# Patient Record
Sex: Female | Born: 1985
Health system: Southern US, Community
[De-identification: ages and names within clinical notes are randomized; demographics above are authoritative.]

## PROBLEM LIST (undated history)

## (undated) DIAGNOSIS — Z720 Tobacco use: Secondary | ICD-10-CM

## (undated) HISTORY — PX: WISDOM TOOTH EXTRACTION: SHX21

## (undated) HISTORY — DX: Tobacco use: Z72.0

---

## 2007-11-19 ENCOUNTER — Ambulatory Visit: Payer: Self-pay | Admitting: Internal Medicine

## 2007-11-20 ENCOUNTER — Ambulatory Visit: Payer: Self-pay | Admitting: Internal Medicine

## 2015-03-17 ENCOUNTER — Encounter: Payer: Self-pay | Admitting: Family Medicine

## 2015-03-17 ENCOUNTER — Ambulatory Visit (INDEPENDENT_AMBULATORY_CARE_PROVIDER_SITE_OTHER): Payer: BLUE CROSS/BLUE SHIELD | Admitting: Family Medicine

## 2015-03-17 VITALS — BP 120/82 | HR 83 | Temp 98.7°F | Ht 64.4 in | Wt 162.0 lb

## 2015-03-17 DIAGNOSIS — Z0000 Encounter for general adult medical examination without abnormal findings: Secondary | ICD-10-CM | POA: Diagnosis not present

## 2015-03-17 DIAGNOSIS — Z720 Tobacco use: Secondary | ICD-10-CM | POA: Diagnosis not present

## 2015-03-17 LAB — LIPID PANEL PICCOLO, WAIVED
CHOL/HDL RATIO PICCOLO,WAIVE: 2.7 mg/dL
Cholesterol Piccolo, Waived: 144 mg/dL (ref <200)
HDL Chol Piccolo, Waived: 54 mg/dL — ABNORMAL LOW (ref >59)
LDL Chol Calc Piccolo Waived: 73 mg/dL (ref <100)
Triglycerides Piccolo,Waived: 83 mg/dL (ref <150)
VLDL Chol Calc Piccolo,Waive: 17 mg/dL (ref <30)

## 2015-03-17 LAB — GLUCOSE PICCOLO, WAIVED: GLUCOSE PICCOLO, WAIVED: 93 mg/dL (ref 73–118)

## 2015-03-17 NOTE — Assessment & Plan Note (Signed)
Not interested in quitting right now. Will let us know when she is. 

## 2015-03-17 NOTE — Progress Notes (Signed)
BP 120/82 mmHg  Pulse 83  Temp(Src) 98.7 F (37.1 C)  Ht 5' 4.4" (1.636 m)  Wt 162 lb (73.483 kg)  BMI 27.45 kg/m2  SpO2 98%   Subjective:    Patient ID: Danielle Pearson, female    DOB: 28-Mar-1986, 29 y.o.   MRN: [id number]  HPI: Danielle Pearson is a 29 y.o. female presenting on 03/17/2015 for comprehensive medical examination. Current medical complaints include: None, needs a form filled out for work  Menopausal Symptoms: no  Depression Screen done today and results listed below:  Depression screen [facility] 2/9 03/17/2015  Decreased Interest 0  Down, Depressed, Hopeless 0  PHQ - 2 Score 0     Past Medical History:  Past Medical History  Diagnosis Date  . Tobacco abuse     Surgical History:  Past Surgical History  Procedure Laterality Date  . Wisdom tooth extraction      Medications:  No current outpatient prescriptions on file prior to visit.   No current facility-administered medications on file prior to visit.    Allergies:  No Known Allergies  Social History:  Social History   Social History  . Marital Status: Married    Spouse Name: N/A  . Number of Children: N/A  . Years of Education: N/A   Occupational History  . Not on file.   Social History Main Topics  . Smoking status: Current Every Day Smoker -- 0.50 packs/day    Types: Cigarettes  . Smokeless tobacco: Never Used  . Alcohol Use: 0.0 oz/week    0 Standard drinks or equivalent per week     Comment: Socially  . Drug Use: No  . Sexual Activity: Yes    Birth Control/ Protection: IUD   Other Topics Concern  . Not on file   Social History Narrative   History  Smoking status  . Current Every Day Smoker -- 0.50 packs/day  . Types: Cigarettes  Smokeless tobacco  . Never Used   History  Alcohol Use  . 0.0 oz/week  . 0 Standard drinks or equivalent per week    Comment: Socially    Family History:  History reviewed. No pertinent family history.  Past medical history, surgical history,  medications, allergies, family history and social history reviewed with patient today and changes made to appropriate areas of the chart.   Review of Systems  Constitutional: Negative.   HENT: Negative.   Eyes: Negative.   Respiratory: Negative.   Cardiovascular: Negative.   Gastrointestinal: Negative.   Genitourinary: Negative.   Musculoskeletal: Negative.   Skin: Negative.   Neurological: Negative.   Endo/Heme/Allergies: Negative.   Psychiatric/Behavioral: Negative.     All other ROS negative except what is listed above and in the HPI.      Objective:    BP 120/82 mmHg  Pulse 83  Temp(Src) 98.7 F (37.1 C)  Ht 5' 4.4" (1.636 m)  Wt 162 lb (73.483 kg)  BMI 27.45 kg/m2  SpO2 98%  Wt Readings from Last 3 Encounters:  03/17/15 162 lb (73.483 kg)    Physical Exam  Constitutional: She is oriented to person, place, and time. She appears well-developed and well-nourished. No distress.  HENT:  Head: Normocephalic and atraumatic.  Right Ear: Hearing and external ear normal.  Left Ear: Hearing and external ear normal.  Nose: Nose normal.  Mouth/Throat: Oropharynx is clear and moist. No oropharyngeal exudate.  Eyes: Conjunctivae, EOM and lids are normal. Pupils are equal, round, and reactive to light. Right eye exhibits  no discharge. Left eye exhibits no discharge. No scleral icterus.  Neck: Normal range of motion. Neck supple. No JVD present. No tracheal deviation present. No thyromegaly present.  Cardiovascular: Normal rate, regular rhythm, normal heart sounds and intact distal pulses.  Exam reveals no gallop and no friction rub.   No murmur heard. Pulmonary/Chest: Effort normal and breath sounds normal. No stridor. No respiratory distress. She has no wheezes. She has no rales. She exhibits no tenderness.  Abdominal: Soft. Bowel sounds are normal. She exhibits no distension and no mass. There is no tenderness. There is no rebound and no guarding.  Genitourinary:  Breast and  GYN exam deferred, done at GYN  Musculoskeletal: Normal range of motion. She exhibits no edema or tenderness.  Lymphadenopathy:    She has no cervical adenopathy.  Neurological: She is alert and oriented to person, place, and time. She has normal reflexes. She displays normal reflexes. No cranial nerve deficit. She exhibits normal muscle tone. Coordination normal.  Skin: Skin is warm, dry and intact. No rash noted. She is not diaphoretic. No erythema. No pallor.  Psychiatric: She has a normal mood and affect. Her speech is normal and behavior is normal. Judgment and thought content normal. Cognition and memory are normal.  Nursing note and vitals reviewed.   No results found for this or any previous visit.    Assessment & Plan:   Problem List Items Addressed This Visit      Other   Tobacco abuse    Not interested in quitting right now. Will let us know when she is.        Other Visit Diagnoses    Routine general medical examination at a health care facility    -  Primary    Up to date on Tdap, due for Pap, but sees GYN. Screening labs done today. Work on diet and exercise. Form for work filled out and scanned.     Relevant Orders    CBC with Differential/Platelet    Comprehensive metabolic panel    Glucose Hemocue Waived    Lipid Panel Piccolo, Waived    TSH        Follow up plan: Return in about 1 year (around 03/16/2016) for Physical.   LABORATORY TESTING:  - Pap smear: done elsewhere  IMMUNIZATIONS:   - Tdap: Tetanus vaccination status reviewed: last tetanus booster within 10 years. - Influenza: Refused - Pneumovax: Refused  PATIENT COUNSELING:   Advised to take 1 mg of folate supplement per day if capable of pregnancy.   Sexuality: Discussed sexually transmitted diseases, partner selection, use of condoms, avoidance of unintended pregnancy  and contraceptive alternatives.   Advised to avoid cigarette smoking.  I discussed with the patient that most people  either abstain from alcohol or drink within safe limits (<=14/week and <=4 drinks/occasion for males, <=7/weeks and <= 3 drinks/occasion for females) and that the risk for alcohol disorders and other health effects rises proportionally with the number of drinks per week and how often a drinker exceeds daily limits.  Discussed cessation/primary prevention of drug use and availability of treatment for abuse.   Diet: Encouraged to adjust caloric intake to maintain  or achieve ideal body weight, to reduce intake of dietary saturated fat and total fat, to limit sodium intake by avoiding high sodium foods and not adding table salt, and to maintain adequate dietary potassium and calcium preferably from fresh fruits, vegetables, and low-fat dairy products.    stressed the importance of regular  exercise  Injury prevention: Discussed safety belts, safety helmets, smoke detector, smoking near bedding or upholstery.   Dental health: Discussed importance of regular tooth brushing, flossing, and dental visits.    NEXT PREVENTATIVE PHYSICAL DUE IN 1 YEAR. Return in about 1 year (around 03/16/2016) for Physical.

## 2015-03-17 NOTE — Patient Instructions (Signed)
Preventive Care for Adults A healthy lifestyle and preventive care can promote health and wellness. Preventive health guidelines for women include the following key practices.  A routine yearly physical is a good way to check with your health care provider about your health and preventive screening. It is a chance to share any concerns and updates on your health and to receive a thorough exam.  Visit your dentist for a routine exam and preventive care every 6 months. Brush your teeth twice a day and floss once a day. Good oral hygiene prevents tooth decay and gum disease.  The frequency of eye exams is based on your age, health, family medical history, use of contact lenses, and other factors. Follow your health care provider's recommendations for frequency of eye exams.  Eat a healthy diet. Foods like vegetables, fruits, whole grains, low-fat dairy products, and lean protein foods contain the nutrients you need without too many calories. Decrease your intake of foods high in solid fats, added sugars, and salt. Eat the right amount of calories for you.Get information about a proper diet from your health care provider, if necessary.  Regular physical exercise is one of the most important things you can do for your health. Most adults should get at least 150 minutes of moderate-intensity exercise (any activity that increases your heart rate and causes you to sweat) each week. In addition, most adults need muscle-strengthening exercises on 2 or more days a week.  Maintain a healthy weight. The body mass index (BMI) is a screening tool to identify possible weight problems. It provides an estimate of body fat based on height and weight. Your health care provider can find your BMI and can help you achieve or maintain a healthy weight.For adults 20 years and older:  A BMI below 18.5 is considered underweight.  A BMI of 18.5 to 24.9 is normal.  A BMI of 25 to 29.9 is considered overweight.  A BMI of  30 and above is considered obese.  Maintain normal blood lipids and cholesterol levels by exercising and minimizing your intake of saturated fat. Eat a balanced diet with plenty of fruit and vegetables. Blood tests for lipids and cholesterol should begin at age 76 and be repeated every 5 years. If your lipid or cholesterol levels are high, you are over 50, or you are at high risk for heart disease, you may need your cholesterol levels checked more frequently.Ongoing high lipid and cholesterol levels should be treated with medicines if diet and exercise are not working.  If you smoke, find out from your health care provider how to quit. If you do not use tobacco, do not start.  Lung cancer screening is recommended for adults aged 22-80 years who are at high risk for developing lung cancer because of a history of smoking. A yearly low-dose CT scan of the lungs is recommended for people who have at least a 30-pack-year history of smoking and are a current smoker or have quit within the past 15 years. A pack year of smoking is smoking an average of 1 pack of cigarettes a day for 1 year (for example: 1 pack a day for 30 years or 2 packs a day for 15 years). Yearly screening should continue until the smoker has stopped smoking for at least 15 years. Yearly screening should be stopped for people who develop a health problem that would prevent them from having lung cancer treatment.  If you are pregnant, do not drink alcohol. If you are breastfeeding,  be very cautious about drinking alcohol. If you are not pregnant and choose to drink alcohol, do not have more than 1 drink per day. One drink is considered to be 12 ounces (355 mL) of beer, 5 ounces (148 mL) of wine, or 1.5 ounces (44 mL) of liquor.  Avoid use of street drugs. Do not share needles with anyone. Ask for help if you need support or instructions about stopping the use of drugs.  High blood pressure causes heart disease and increases the risk of  stroke. Your blood pressure should be checked at least every 1 to 2 years. Ongoing high blood pressure should be treated with medicines if weight loss and exercise do not work.  If you are 3-86 years old, ask your health care provider if you should take aspirin to prevent strokes.  Diabetes screening involves taking a blood sample to check your fasting blood sugar level. This should be done once every 3 years, after age 67, if you are within normal weight and without risk factors for diabetes. Testing should be considered at a younger age or be carried out more frequently if you are overweight and have at least 1 risk factor for diabetes.  Breast cancer screening is essential preventive care for women. You should practice "breast self-awareness." This means understanding the normal appearance and feel of your breasts and may include breast self-examination. Any changes detected, no matter how small, should be reported to a health care provider. Women in their 8s and 30s should have a clinical breast exam (CBE) by a health care provider as part of a regular health exam every 1 to 3 years. After age 70, women should have a CBE every year. Starting at age 25, women should consider having a mammogram (breast X-ray test) every year. Women who have a family history of breast cancer should talk to their health care provider about genetic screening. Women at a high risk of breast cancer should talk to their health care providers about having an MRI and a mammogram every year.  Breast cancer gene (BRCA)-related cancer risk assessment is recommended for women who have family members with BRCA-related cancers. BRCA-related cancers include breast, ovarian, tubal, and peritoneal cancers. Having family members with these cancers may be associated with an increased risk for harmful changes (mutations) in the breast cancer genes BRCA1 and BRCA2. Results of the assessment will determine the need for genetic counseling and  BRCA1 and BRCA2 testing.  Routine pelvic exams to screen for cancer are no longer recommended for nonpregnant women who are considered low risk for cancer of the pelvic organs (ovaries, uterus, and vagina) and who do not have symptoms. Ask your health care provider if a screening pelvic exam is right for you.  If you have had past treatment for cervical cancer or a condition that could lead to cancer, you need Pap tests and screening for cancer for at least 20 years after your treatment. If Pap tests have been discontinued, your risk factors (such as having a new sexual partner) need to be reassessed to determine if screening should be resumed. Some women have medical problems that increase the chance of getting cervical cancer. In these cases, your health care provider may recommend more frequent screening and Pap tests.  The HPV test is an additional test that may be used for cervical cancer screening. The HPV test looks for the virus that can cause the cell changes on the cervix. The cells collected during the Pap test can be  tested for HPV. The HPV test could be used to screen women aged 30 years and older, and should be used in women of any age who have unclear Pap test results. After the age of 30, women should have HPV testing at the same frequency as a Pap test.  Colorectal cancer can be detected and often prevented. Most routine colorectal cancer screening begins at the age of 50 years and continues through age 75 years. However, your health care provider may recommend screening at an earlier age if you have risk factors for colon cancer. On a yearly basis, your health care provider may provide home test kits to check for hidden blood in the stool. Use of a small camera at the end of a tube, to directly examine the colon (sigmoidoscopy or colonoscopy), can detect the earliest forms of colorectal cancer. Talk to your health care provider about this at age 50, when routine screening begins. Direct  exam of the colon should be repeated every 5-10 years through age 75 years, unless early forms of pre-cancerous polyps or small growths are found.  People who are at an increased risk for hepatitis B should be screened for this virus. You are considered at high risk for hepatitis B if:  You were born in a country where hepatitis B occurs often. Talk with your health care provider about which countries are considered high risk.  Your parents were born in a high-risk country and you have not received a shot to protect against hepatitis B (hepatitis B vaccine).  You have HIV or AIDS.  You use needles to inject street drugs.  You live with, or have sex with, someone who has hepatitis B.  You get hemodialysis treatment.  You take certain medicines for conditions like cancer, organ transplantation, and autoimmune conditions.  Hepatitis C blood testing is recommended for all people born from 1945 through 1965 and any individual with known risks for hepatitis C.  Practice safe sex. Use condoms and avoid high-risk sexual practices to reduce the spread of sexually transmitted infections (STIs). STIs include gonorrhea, chlamydia, syphilis, trichomonas, herpes, HPV, and human immunodeficiency virus (HIV). Herpes, HIV, and HPV are viral illnesses that have no cure. They can result in disability, cancer, and death.  You should be screened for sexually transmitted illnesses (STIs) including gonorrhea and chlamydia if:  You are sexually active and are younger than 24 years.  You are older than 24 years and your health care provider tells you that you are at risk for this type of infection.  Your sexual activity has changed since you were last screened and you are at an increased risk for chlamydia or gonorrhea. Ask your health care provider if you are at risk.  If you are at risk of being infected with HIV, it is recommended that you take a prescription medicine daily to prevent HIV infection. This is  called preexposure prophylaxis (PrEP). You are considered at risk if:  You are a heterosexual woman, are sexually active, and are at increased risk for HIV infection.  You take drugs by injection.  You are sexually active with a partner who has HIV.  Talk with your health care provider about whether you are at high risk of being infected with HIV. If you choose to begin PrEP, you should first be tested for HIV. You should then be tested every 3 months for as long as you are taking PrEP.  Osteoporosis is a disease in which the bones lose minerals and strength   with aging. This can result in serious bone fractures or breaks. The risk of osteoporosis can be identified using a bone density scan. Women ages 65 years and over and women at risk for fractures or osteoporosis should discuss screening with their health care providers. Ask your health care provider whether you should take a calcium supplement or vitamin D to reduce the rate of osteoporosis.  Menopause can be associated with physical symptoms and risks. Hormone replacement therapy is available to decrease symptoms and risks. You should talk to your health care provider about whether hormone replacement therapy is right for you.  Use sunscreen. Apply sunscreen liberally and repeatedly throughout the day. You should seek shade when your shadow is shorter than you. Protect yourself by wearing long sleeves, pants, a wide-brimmed hat, and sunglasses year round, whenever you are outdoors.  Once a month, do a whole body skin exam, using a mirror to look at the skin on your back. Tell your health care provider of new moles, moles that have irregular borders, moles that are larger than a pencil eraser, or moles that have changed in shape or color.  Stay current with required vaccines (immunizations).  Influenza vaccine. All adults should be immunized every year.  Tetanus, diphtheria, and acellular pertussis (Td, Tdap) vaccine. Pregnant women should  receive 1 dose of Tdap vaccine during each pregnancy. The dose should be obtained regardless of the length of time since the last dose. Immunization is preferred during the 27th-36th week of gestation. An adult who has not previously received Tdap or who does not know her vaccine status should receive 1 dose of Tdap. This initial dose should be followed by tetanus and diphtheria toxoids (Td) booster doses every 10 years. Adults with an unknown or incomplete history of completing a 3-dose immunization series with Td-containing vaccines should begin or complete a primary immunization series including a Tdap dose. Adults should receive a Td booster every 10 years.  Varicella vaccine. An adult without evidence of immunity to varicella should receive 2 doses or a second dose if she has previously received 1 dose. Pregnant females who do not have evidence of immunity should receive the first dose after pregnancy. This first dose should be obtained before leaving the health care facility. The second dose should be obtained 4-8 weeks after the first dose.  Human papillomavirus (HPV) vaccine. Females aged 13-26 years who have not received the vaccine previously should obtain the 3-dose series. The vaccine is not recommended for use in pregnant females. However, pregnancy testing is not needed before receiving a dose. If a female is found to be pregnant after receiving a dose, no treatment is needed. In that case, the remaining doses should be delayed until after the pregnancy. Immunization is recommended for any person with an immunocompromised condition through the age of 26 years if she did not get any or all doses earlier. During the 3-dose series, the second dose should be obtained 4-8 weeks after the first dose. The third dose should be obtained 24 weeks after the first dose and 16 weeks after the second dose.  Zoster vaccine. One dose is recommended for adults aged 60 years or older unless certain conditions are  present.  Measles, mumps, and rubella (MMR) vaccine. Adults born before 1957 generally are considered immune to measles and mumps. Adults born in 1957 or later should have 1 or more doses of MMR vaccine unless there is a contraindication to the vaccine or there is laboratory evidence of immunity to   each of the three diseases. A routine second dose of MMR vaccine should be obtained at least 28 days after the first dose for students attending postsecondary schools, health care workers, or international travelers. People who received inactivated measles vaccine or an unknown type of measles vaccine during 1963-1967 should receive 2 doses of MMR vaccine. People who received inactivated mumps vaccine or an unknown type of mumps vaccine before 1979 and are at high risk for mumps infection should consider immunization with 2 doses of MMR vaccine. For females of childbearing age, rubella immunity should be determined. If there is no evidence of immunity, females who are not pregnant should be vaccinated. If there is no evidence of immunity, females who are pregnant should delay immunization until after pregnancy. Unvaccinated health care workers born before 1957 who lack laboratory evidence of measles, mumps, or rubella immunity or laboratory confirmation of disease should consider measles and mumps immunization with 2 doses of MMR vaccine or rubella immunization with 1 dose of MMR vaccine.  Pneumococcal 13-valent conjugate (PCV13) vaccine. When indicated, a person who is uncertain of her immunization history and has no record of immunization should receive the PCV13 vaccine. An adult aged 19 years or older who has certain medical conditions and has not been previously immunized should receive 1 dose of PCV13 vaccine. This PCV13 should be followed with a dose of pneumococcal polysaccharide (PPSV23) vaccine. The PPSV23 vaccine dose should be obtained at least 8 weeks after the dose of PCV13 vaccine. An adult aged 19  years or older who has certain medical conditions and previously received 1 or more doses of PPSV23 vaccine should receive 1 dose of PCV13. The PCV13 vaccine dose should be obtained 1 or more years after the last PPSV23 vaccine dose.  Pneumococcal polysaccharide (PPSV23) vaccine. When PCV13 is also indicated, PCV13 should be obtained first. All adults aged 65 years and older should be immunized. An adult younger than age 65 years who has certain medical conditions should be immunized. Any person who resides in a nursing home or long-term care facility should be immunized. An adult smoker should be immunized. People with an immunocompromised condition and certain other conditions should receive both PCV13 and PPSV23 vaccines. People with human immunodeficiency virus (HIV) infection should be immunized as soon as possible after diagnosis. Immunization during chemotherapy or radiation therapy should be avoided. Routine use of PPSV23 vaccine is not recommended for American Indians, [place] Natives, or people younger than 65 years unless there are medical conditions that require PPSV23 vaccine. When indicated, people who have unknown immunization and have no record of immunization should receive PPSV23 vaccine. One-time revaccination 5 years after the first dose of PPSV23 is recommended for people aged 19-64 years who have chronic kidney failure, nephrotic syndrome, asplenia, or immunocompromised conditions. People who received 1-2 doses of PPSV23 before age 65 years should receive another dose of PPSV23 vaccine at age 65 years or later if at least 5 years have passed since the previous dose. Doses of PPSV23 are not needed for people immunized with PPSV23 at or after age 65 years.  Meningococcal vaccine. Adults with asplenia or persistent complement component deficiencies should receive 2 doses of quadrivalent meningococcal conjugate (MenACWY-D) vaccine. The doses should be obtained at least 2 months apart.  Microbiologists working with certain meningococcal bacteria, military recruits, people at risk during an outbreak, and people who travel to or live in countries with a high rate of meningitis should be immunized. A first-year college student up through age   21 years who is living in a residence hall should receive a dose if she did not receive a dose on or after her 16th birthday. Adults who have certain high-risk conditions should receive one or more doses of vaccine.  Hepatitis A vaccine. Adults who wish to be protected from this disease, have certain high-risk conditions, work with hepatitis A-infected animals, work in hepatitis A research labs, or travel to or work in countries with a high rate of hepatitis A should be immunized. Adults who were previously unvaccinated and who anticipate close contact with an international adoptee during the first 60 days after arrival in the [place] from a country with a high rate of hepatitis A should be immunized.  Hepatitis B vaccine. Adults who wish to be protected from this disease, have certain high-risk conditions, may be exposed to blood or other infectious body fluids, are household contacts or sex partners of hepatitis B positive people, are clients or workers in certain care facilities, or travel to or work in countries with a high rate of hepatitis B should be immunized.  Haemophilus influenzae type b (Hib) vaccine. A previously unvaccinated person with asplenia or sickle cell disease or having a scheduled splenectomy should receive 1 dose of Hib vaccine. Regardless of previous immunization, a recipient of a hematopoietic stem cell transplant should receive a 3-dose series 6-12 months after her successful transplant. Hib vaccine is not recommended for adults with HIV infection. Preventive Services / Frequency Ages 64 to 68 years  Blood pressure check.** / Every 1 to 2 years.  Lipid and cholesterol check.** / Every 5 years beginning at age  22.  Clinical breast exam.** / Every 3 years for women in their 88s and 53s.  BRCA-related cancer risk assessment.** / For women who have family members with a BRCA-related cancer (breast, ovarian, tubal, or peritoneal cancers).  Pap test.** / Every 2 years from ages 90 through 51. Every 3 years starting at age 21 through age 56 or 3 with a history of 3 consecutive normal Pap tests.  HPV screening.** / Every 3 years from ages 24 through ages 1 to 46 with a history of 3 consecutive normal Pap tests.  Hepatitis C blood test.** / For any individual with known risks for hepatitis C.  Skin self-exam. / Monthly.  Influenza vaccine. / Every year.  Tetanus, diphtheria, and acellular pertussis (Tdap, Td) vaccine.** / Consult your health care provider. Pregnant women should receive 1 dose of Tdap vaccine during each pregnancy. 1 dose of Td every 10 years.  Varicella vaccine.** / Consult your health care provider. Pregnant females who do not have evidence of immunity should receive the first dose after pregnancy.  HPV vaccine. / 3 doses over 6 months, if 72 and younger. The vaccine is not recommended for use in pregnant females. However, pregnancy testing is not needed before receiving a dose.  Measles, mumps, rubella (MMR) vaccine.** / You need at least 1 dose of MMR if you were born in 1957 or later. You may also need a 2nd dose. For females of childbearing age, rubella immunity should be determined. If there is no evidence of immunity, females who are not pregnant should be vaccinated. If there is no evidence of immunity, females who are pregnant should delay immunization until after pregnancy.  Pneumococcal 13-valent conjugate (PCV13) vaccine.** / Consult your health care provider.  Pneumococcal polysaccharide (PPSV23) vaccine.** / 1 to 2 doses if you smoke cigarettes or if you have certain conditions.  Meningococcal vaccine.** /  1 dose if you are age 19 to 21 years and a first-year college  student living in a residence hall, or have one of several medical conditions, you need to get vaccinated against meningococcal disease. You may also need additional booster doses.  Hepatitis A vaccine.** / Consult your health care provider.  Hepatitis B vaccine.** / Consult your health care provider.  Haemophilus influenzae type b (Hib) vaccine.** / Consult your health care provider. Ages 40 to 64 years  Blood pressure check.** / Every 1 to 2 years.  Lipid and cholesterol check.** / Every 5 years beginning at age 20 years.  Lung cancer screening. / Every year if you are aged 55-80 years and have a 30-pack-year history of smoking and currently smoke or have quit within the past 15 years. Yearly screening is stopped once you have quit smoking for at least 15 years or develop a health problem that would prevent you from having lung cancer treatment.  Clinical breast exam.** / Every year after age 40 years.  BRCA-related cancer risk assessment.** / For women who have family members with a BRCA-related cancer (breast, ovarian, tubal, or peritoneal cancers).  Mammogram.** / Every year beginning at age 40 years and continuing for as long as you are in good health. Consult with your health care provider.  Pap test.** / Every 3 years starting at age 30 years through age 65 or 70 years with a history of 3 consecutive normal Pap tests.  HPV screening.** / Every 3 years from ages 30 years through ages 65 to 70 years with a history of 3 consecutive normal Pap tests.  Fecal occult blood test (FOBT) of stool. / Every year beginning at age 50 years and continuing until age 75 years. You may not need to do this test if you get a colonoscopy every 10 years.  Flexible sigmoidoscopy or colonoscopy.** / Every 5 years for a flexible sigmoidoscopy or every 10 years for a colonoscopy beginning at age 50 years and continuing until age 75 years.  Hepatitis C blood test.** / For all people born from 1945 through  1965 and any individual with known risks for hepatitis C.  Skin self-exam. / Monthly.  Influenza vaccine. / Every year.  Tetanus, diphtheria, and acellular pertussis (Tdap/Td) vaccine.** / Consult your health care provider. Pregnant women should receive 1 dose of Tdap vaccine during each pregnancy. 1 dose of Td every 10 years.  Varicella vaccine.** / Consult your health care provider. Pregnant females who do not have evidence of immunity should receive the first dose after pregnancy.  Zoster vaccine.** / 1 dose for adults aged 60 years or older.  Measles, mumps, rubella (MMR) vaccine.** / You need at least 1 dose of MMR if you were born in 1957 or later. You may also need a 2nd dose. For females of childbearing age, rubella immunity should be determined. If there is no evidence of immunity, females who are not pregnant should be vaccinated. If there is no evidence of immunity, females who are pregnant should delay immunization until after pregnancy.  Pneumococcal 13-valent conjugate (PCV13) vaccine.** / Consult your health care provider.  Pneumococcal polysaccharide (PPSV23) vaccine.** / 1 to 2 doses if you smoke cigarettes or if you have certain conditions.  Meningococcal vaccine.** / Consult your health care provider.  Hepatitis A vaccine.** / Consult your health care provider.  Hepatitis B vaccine.** / Consult your health care provider.  Haemophilus influenzae type b (Hib) vaccine.** / Consult your health care provider. Ages 65   years and over  Blood pressure check.** / Every 1 to 2 years.  Lipid and cholesterol check.** / Every 5 years beginning at age 22 years.  Lung cancer screening. / Every year if you are aged 73-80 years and have a 30-pack-year history of smoking and currently smoke or have quit within the past 15 years. Yearly screening is stopped once you have quit smoking for at least 15 years or develop a health problem that would prevent you from having lung cancer  treatment.  Clinical breast exam.** / Every year after age 4 years.  BRCA-related cancer risk assessment.** / For women who have family members with a BRCA-related cancer (breast, ovarian, tubal, or peritoneal cancers).  Mammogram.** / Every year beginning at age 40 years and continuing for as long as you are in good health. Consult with your health care provider.  Pap test.** / Every 3 years starting at age 9 years through age 34 or 91 years with 3 consecutive normal Pap tests. Testing can be stopped between 65 and 70 years with 3 consecutive normal Pap tests and no abnormal Pap or HPV tests in the past 10 years.  HPV screening.** / Every 3 years from ages 57 years through ages 64 or 45 years with a history of 3 consecutive normal Pap tests. Testing can be stopped between 65 and 70 years with 3 consecutive normal Pap tests and no abnormal Pap or HPV tests in the past 10 years.  Fecal occult blood test (FOBT) of stool. / Every year beginning at age 15 years and continuing until age 17 years. You may not need to do this test if you get a colonoscopy every 10 years.  Flexible sigmoidoscopy or colonoscopy.** / Every 5 years for a flexible sigmoidoscopy or every 10 years for a colonoscopy beginning at age 86 years and continuing until age 71 years.  Hepatitis C blood test.** / For all people born from [birth year] through 1965 and any individual with known risks for hepatitis C.  Osteoporosis screening.** / A one-time screening for women ages 83 years and over and women at risk for fractures or osteoporosis.  Skin self-exam. / Monthly.  Influenza vaccine. / Every year.  Tetanus, diphtheria, and acellular pertussis (Tdap/Td) vaccine.** / 1 dose of Td every 10 years.  Varicella vaccine.** / Consult your health care provider.  Zoster vaccine.** / 1 dose for adults aged 61 years or older.  Pneumococcal 13-valent conjugate (PCV13) vaccine.** / Consult your health care provider.  Pneumococcal  polysaccharide (PPSV23) vaccine.** / 1 dose for all adults aged 28 years and older.  Meningococcal vaccine.** / Consult your health care provider.  Hepatitis A vaccine.** / Consult your health care provider.  Hepatitis B vaccine.** / Consult your health care provider.  Haemophilus influenzae type b (Hib) vaccine.** / Consult your health care provider. ** Family history and personal history of risk and conditions may change your health care provider's recommendations. Document Released: 07/30/2001 Document Revised: 10/18/2013 Document Reviewed: 10/29/2010 [facility] Patient Information 2015 [place], [place]. This information is not intended to replace advice given to you by your health care provider. Make sure you discuss any questions you have with your health care provider.

## 2015-03-18 LAB — COMPREHENSIVE METABOLIC PANEL
A/G RATIO: 2.1 (ref 1.1–2.5)
ALBUMIN: 4.7 g/dL (ref 3.5–5.5)
ALT: 18 IU/L (ref 0–32)
AST: 12 IU/L (ref 0–40)
Alkaline Phosphatase: 43 IU/L (ref 39–117)
BUN/Creatinine Ratio: 13 (ref 8–20)
BUN: 9 mg/dL (ref 6–20)
Bilirubin Total: 0.6 mg/dL (ref 0.0–1.2)
CALCIUM: 9.6 mg/dL (ref 8.7–10.2)
CO2: 20 mmol/L (ref 18–29)
CREATININE: 0.7 mg/dL (ref 0.57–1.00)
Chloride: 103 mmol/L (ref 97–108)
GFR, EST AFRICAN AMERICAN: 135 mL/min/{1.73_m2} (ref >59)
GFR, EST NON AFRICAN AMERICAN: 117 mL/min/{1.73_m2} (ref >59)
GLOBULIN, TOTAL: 2.2 g/dL (ref 1.5–4.5)
Glucose: 87 mg/dL (ref 65–99)
POTASSIUM: 4.6 mmol/L (ref 3.5–5.2)
Sodium: 137 mmol/L (ref 134–144)
TOTAL PROTEIN: 6.9 g/dL (ref 6.0–8.5)

## 2015-03-18 LAB — CBC WITH DIFFERENTIAL/PLATELET
BASOS: 1 %
Basophils Absolute: 0.1 10*3/uL (ref 0.0–0.2)
EOS (ABSOLUTE): 0.4 10*3/uL (ref 0.0–0.4)
EOS: 7 %
HEMATOCRIT: 40.1 % (ref 34.0–46.6)
HEMOGLOBIN: 13.5 g/dL (ref 11.1–15.9)
IMMATURE GRANS (ABS): 0 10*3/uL (ref 0.0–0.1)
IMMATURE GRANULOCYTES: 0 %
LYMPHS: 40 %
Lymphocytes Absolute: 2.7 10*3/uL (ref 0.7–3.1)
MCH: 32.4 pg (ref 26.6–33.0)
MCHC: 33.7 g/dL (ref 31.5–35.7)
MCV: 96 fL (ref 79–97)
MONOCYTES: 7 %
MONOS ABS: 0.5 10*3/uL (ref 0.1–0.9)
NEUTROS PCT: 45 %
Neutrophils Absolute: 3 10*3/uL (ref 1.4–7.0)
Platelets: 377 10*3/uL (ref 150–379)
RBC: 4.17 x10E6/uL (ref 3.77–5.28)
RDW: 12.5 % (ref 12.3–15.4)
WBC: 6.7 10*3/uL (ref 3.4–10.8)

## 2015-03-18 LAB — TSH: TSH: 2.57 u[IU]/mL (ref 0.450–4.500)

## 2015-03-20 ENCOUNTER — Encounter: Payer: Self-pay | Admitting: Family Medicine

## 2015-09-14 ENCOUNTER — Ambulatory Visit: Payer: BLUE CROSS/BLUE SHIELD | Admitting: Family Medicine

## 2016-01-29 ENCOUNTER — Ambulatory Visit (INDEPENDENT_AMBULATORY_CARE_PROVIDER_SITE_OTHER): Payer: BLUE CROSS/BLUE SHIELD | Admitting: Family Medicine

## 2016-01-29 ENCOUNTER — Encounter: Payer: Self-pay | Admitting: Family Medicine

## 2016-01-29 VITALS — BP 131/84 | HR 96 | Temp 98.3°F | Ht 64.7 in | Wt 164.0 lb

## 2016-01-29 DIAGNOSIS — Z021 Encounter for pre-employment examination: Secondary | ICD-10-CM

## 2016-01-29 DIAGNOSIS — M5431 Sciatica, right side: Secondary | ICD-10-CM | POA: Diagnosis not present

## 2016-01-29 LAB — LP+GLUCOSE PICCOLO, WAIVED
CHOL/HDL RATIO PICCOLO,WAIVE: 3 mg/dL
Cholesterol Piccolo, Waived: 157 mg/dL (ref <200)
GLUCOSE PICCOLO, WAIVED: 94 mg/dL (ref 73–118)
HDL Chol Piccolo, Waived: 52 mg/dL — ABNORMAL LOW (ref >59)
LDL CHOL CALC PICCOLO WAIVED: 75 mg/dL (ref <100)
Triglycerides Piccolo,Waived: 153 mg/dL — ABNORMAL HIGH (ref <150)
VLDL Chol Calc Piccolo,Waive: 31 mg/dL — ABNORMAL HIGH (ref <30)

## 2016-01-29 MED ORDER — CYCLOBENZAPRINE HCL 10 MG PO TABS: 10.0000 mg | ORAL_TABLET | Freq: Every day | ORAL | 0 refills | Status: DC

## 2016-01-29 NOTE — Progress Notes (Signed)
BP 131/84 (BP Location: Left Arm, Patient Position: Sitting, Cuff Size: Normal)   Pulse 96   Temp 98.3 F (36.8 C)   Ht 5' 4.7" (1.643 m)   Wt 164 lb (74.4 kg)   SpO2 99%   BMI 27.54 kg/m    Subjective:    Patient ID: Danielle Pearson, female    DOB: [birth year], 30 y.o.   MRN: [id number]  HPI: Danielle Pearson is a 30 y.o. female  Chief Complaint  Patient presents with  . wellness form   BACK PAIN Duration: weeks Mechanism of injury: unknown Location: R>L and low back Onset: sudden Severity: mild Quality: dull aching Frequency: intermittent about 1x a week Radiation: R leg below the knee Aggravating factors: unknown Alleviating factors: nothing Status: fluctuating Treatments attempted: ibuprofen 800mg   Relief with NSAIDs?: mild Nighttime pain:  no Paresthesias / decreased sensation:  yes Bowel / bladder incontinence:  no Fevers:  no Dysuria / urinary frequency:  no  Needs a form for work filled out and needs Lipid and glucose  Relevant past medical, surgical, family and social history reviewed and updated as indicated. Interim medical history since our last visit reviewed. Allergies and medications reviewed and updated.  Review of Systems  Constitutional: Negative.   Respiratory: Negative.   Cardiovascular: Negative.   Musculoskeletal: Positive for back pain and myalgias. Negative for arthralgias, gait problem, joint swelling, neck pain and neck stiffness.  Psychiatric/Behavioral: Negative.     Per HPI unless specifically indicated above     Objective:    BP 131/84 (BP Location: Left Arm, Patient Position: Sitting, Cuff Size: Normal)   Pulse 96   Temp 98.3 F (36.8 C)   Ht 5' 4.7" (1.643 m)   Wt 164 lb (74.4 kg)   SpO2 99%   BMI 27.54 kg/m   Wt Readings from Last 3 Encounters:  01/29/16 164 lb (74.4 kg)  03/17/15 162 lb (73.5 kg)    Physical Exam  Constitutional: She is oriented to person, place, and time. She appears well-developed and  well-nourished. No distress.  HENT:  Head: Normocephalic and atraumatic.  Right Ear: Hearing normal.  Left Ear: Hearing normal.  Nose: Nose normal.  Eyes: Conjunctivae and lids are normal. Right eye exhibits no discharge. Left eye exhibits no discharge. No scleral icterus.  Pulmonary/Chest: Effort normal. No respiratory distress.  Neurological: She is alert and oriented to person, place, and time.  Skin: Skin is warm, dry and intact. No rash noted. She is not diaphoretic. No erythema. No pallor.  Psychiatric: She has a normal mood and affect. Her speech is normal and behavior is normal. Judgment and thought content normal. Cognition and memory are normal.  Nursing note and vitals reviewed. Back Exam:    Inspection:  Normal spinal curvature.  No deformity, ecchymosis, erythema, or lesions     Palpation:     Midline spinal tenderness: no      Paralumbar tenderness: no      Parathoracic tenderness: no      Buttocks tenderness: no     Range of Motion:      Flexion: Normal     Extension:Decreased     Lateral bending:Decreased    Rotation:Normal    Neuro Exam:Lower extremity DTRs normal & symmetric.  Strength and sensation intact.    Special Tests:      Straight leg raise:negative   Results for orders placed or performed in visit on 03/17/15  CBC with Differential/Platelet  Result Value Ref Range  WBC 6.7 3.4 - 10.8 x10E3/uL   RBC 4.17 3.77 - 5.28 x10E6/uL   Hemoglobin 13.5 11.1 - 15.9 g/dL   Hematocrit 16.140.1 09.634.0 - 46.6 %   MCV 96 79 - 97 fL   MCH 32.4 26.6 - 33.0 pg   MCHC 33.7 31.5 - 35.7 g/dL   RDW 04.512.5 40.912.3 - 81.115.4 %   Platelets 377 150 - 379 x10E3/uL   Neutrophils 45 %   Lymphs 40 %   Monocytes 7 %   Eos 7 %   Basos 1 %   Neutrophils Absolute 3.0 1.4 - 7.0 x10E3/uL   Lymphocytes Absolute 2.7 0.7 - 3.1 x10E3/uL   Monocytes Absolute 0.5 0.1 - 0.9 x10E3/uL   EOS (ABSOLUTE) 0.4 0.0 - 0.4 x10E3/uL   Basophils Absolute 0.1 0.0 - 0.2 x10E3/uL   Immature Granulocytes 0 %    Immature Grans (Abs) 0.0 0.0 - 0.1 x10E3/uL  Comprehensive metabolic panel  Result Value Ref Range   Glucose 87 65 - 99 mg/dL   BUN 9 6 - 20 mg/dL   Creatinine, Ser 9.140.70 0.57 - 1.00 mg/dL   GFR calc non Af Amer 117 >59 mL/min/1.73   GFR calc Af Amer 135 >59 mL/min/1.73   BUN/Creatinine Ratio 13 8 - 20   Sodium 137 134 - 144 mmol/L   Potassium 4.6 3.5 - 5.2 mmol/L   Chloride 103 97 - 108 mmol/L   CO2 20 18 - 29 mmol/L   Calcium 9.6 8.7 - 10.2 mg/dL   Total Protein 6.9 6.0 - 8.5 g/dL   Albumin 4.7 3.5 - 5.5 g/dL   Globulin, Total 2.2 1.5 - 4.5 g/dL   Albumin/Globulin Ratio 2.1 1.1 - 2.5   Bilirubin Total 0.6 0.0 - 1.2 mg/dL   Alkaline Phosphatase 43 39 - 117 IU/L   AST 12 0 - 40 IU/L   ALT 18 0 - 32 IU/L  Lipid Panel Piccolo, Waived  Result Value Ref Range   Cholesterol Piccolo, Waived 144 <200 mg/dL   HDL Chol Piccolo, Waived 54 (L) >59 mg/dL   Triglycerides Piccolo,Waived 83 <150 mg/dL   Chol/HDL Ratio Piccolo,Waive 2.7 mg/dL   LDL Chol Calc Piccolo Waived 73 <100 mg/dL   VLDL Chol Calc Piccolo,Waive 17 <30 mg/dL  TSH  Result Value Ref Range   TSH 2.570 0.450 - 4.500 uIU/mL  Glucose Piccolo, Waived  Result Value Ref Range   Glucose Piccolo, Waived 93 73 - 118 mg/dL      Assessment & Plan:   Problem List Items Addressed This Visit    None    Visit Diagnoses    Sciatica of right side    -  Primary   Will start stretches, continue ibuprofen and start flexeril. Call if not getting better or getting worse.    Relevant Medications   cyclobenzaprine (FLEXERIL) 10 MG tablet   Pre-employment health screening examination       Labs checked today. Within normal limits. Continue to monitor.    Relevant Orders   LP+Glucose Piccolo, Waived       Follow up plan: No Follow-up on file.

## 2016-01-29 NOTE — Patient Instructions (Addendum)
Sciatica With Rehab The sciatic nerve runs from the back down the leg and is responsible for sensation and control of the muscles in the back (posterior) side of the thigh, lower leg, and foot. Sciatica is a condition that is characterized by inflammation of this nerve.  SYMPTOMS   Signs of nerve damage, including numbness and/or weakness along the posterior side of the lower extremity.  Pain in the back of the thigh that may also travel down the leg.  Pain that worsens when sitting for long periods of time.  Occasionally, pain in the back or buttock. CAUSES  Inflammation of the sciatic nerve is the cause of sciatica. The inflammation is due to something irritating the nerve. Common sources of irritation include:  Sitting for long periods of time.  Direct trauma to the nerve.  Arthritis of the spine.  Herniated or ruptured disk.  Slipping of the vertebrae (spondylolisthesis).  Pressure from soft tissues, such as muscles or ligament-like tissue (fascia). RISK INCREASES WITH:  Sports that place pressure or stress on the spine (football or weightlifting).  Poor strength and flexibility.  Failure to warm up properly before activity.  Family history of low back pain or disk disorders.  Previous back injury or surgery.  Poor body mechanics, especially when lifting, or poor posture. PREVENTION   Warm up and stretch properly before activity.  Maintain physical fitness:  Strength, flexibility, and endurance.  Cardiovascular fitness.  Learn and use proper technique, especially with posture and lifting. When possible, have coach correct improper technique.  Avoid activities that place stress on the spine. PROGNOSIS If treated properly, then sciatica usually resolves within 6 weeks. However, occasionally surgery is necessary.  RELATED COMPLICATIONS   Permanent nerve damage, including pain, numbness, tingle, or weakness.  Chronic back pain.  Risks of surgery: infection,  bleeding, nerve damage, or damage to surrounding tissues. TREATMENT Treatment initially involves resting from any activities that aggravate your symptoms. The use of ice and medication may help reduce pain and inflammation. The use of strengthening and stretching exercises may help reduce pain with activity. These exercises may be performed at home or with referral to a therapist. A therapist may recommend further treatments, such as transcutaneous electronic nerve stimulation (TENS) or ultrasound. Your caregiver may recommend corticosteroid injections to help reduce inflammation of the sciatic nerve. If symptoms persist despite non-surgical (conservative) treatment, then surgery may be recommended. MEDICATION  If pain medication is necessary, then nonsteroidal anti-inflammatory medications, such as aspirin and ibuprofen, or other minor pain relievers, such as acetaminophen, are often recommended.  Do not take pain medication for 7 days before surgery.  Prescription pain relievers may be given if deemed necessary by your caregiver. Use only as directed and only as much as you need.  Ointments applied to the skin may be helpful.  Corticosteroid injections may be given by your caregiver. These injections should be reserved for the most serious cases, because they may only be given a certain number of times. HEAT AND COLD  Cold treatment (icing) relieves pain and reduces inflammation. Cold treatment should be applied for 10 to 15 minutes every 2 to 3 hours for inflammation and pain and immediately after any activity that aggravates your symptoms. Use ice packs or massage the area with a piece of ice (ice massage).  Heat treatment may be used prior to performing the stretching and strengthening activities prescribed by your caregiver, physical therapist, or athletic trainer. Use a heat pack or soak the injury in warm water.   SEEK MEDICAL CARE IF:  Treatment seems to offer no benefit, or the condition  worsens.  Any medications produce adverse side effects. EXERCISES  RANGE OF MOTION (ROM) AND STRETCHING EXERCISES - Sciatica Most people with sciatic will find that their symptoms worsen with either excessive bending forward (flexion) or arching at the low back (extension). The exercises which will help resolve your symptoms will focus on the opposite motion. Your physician, physical therapist or athletic trainer will help you determine which exercises will be most helpful to resolve your low back pain. Do not complete any exercises without first consulting with your clinician. Discontinue any exercises which worsen your symptoms until you speak to your clinician. If you have pain, numbness or tingling which travels down into your buttocks, leg or foot, the goal of the therapy is for these symptoms to move closer to your back and eventually resolve. Occasionally, these leg symptoms will get better, but your low back pain may worsen; this is typically an indication of progress in your rehabilitation. Be certain to be very alert to any changes in your symptoms and the activities in which you participated in the 24 hours prior to the change. Sharing this information with your clinician will allow him/her to most efficiently treat your condition. These exercises may help you when beginning to rehabilitate your injury. Your symptoms may resolve with or without further involvement from your physician, physical therapist or athletic trainer. While completing these exercises, remember:   Restoring tissue flexibility helps normal motion to return to the joints. This allows healthier, less painful movement and activity.  An effective stretch should be held for at least 30 seconds.  A stretch should never be painful. You should only feel a gentle lengthening or release in the stretched tissue. FLEXION RANGE OF MOTION AND STRETCHING EXERCISES: STRETCH - Flexion, Single Knee to Chest   Lie on a firm bed or floor  with both legs extended in front of you.  Keeping one leg in contact with the floor, bring your opposite knee to your chest. Hold your leg in place by either grabbing behind your thigh or at your knee.  Pull until you feel a gentle stretch in your low back. Hold __________ seconds.  Slowly release your grasp and repeat the exercise with the opposite side. Repeat __________ times. Complete this exercise __________ times per day.  STRETCH - Flexion, Double Knee to Chest  Lie on a firm bed or floor with both legs extended in front of you.  Keeping one leg in contact with the floor, bring your opposite knee to your chest.  Tense your stomach muscles to support your back and then lift your other knee to your chest. Hold your legs in place by either grabbing behind your thighs or at your knees.  Pull both knees toward your chest until you feel a gentle stretch in your low back. Hold __________ seconds.  Tense your stomach muscles and slowly return one leg at a time to the floor. Repeat __________ times. Complete this exercise __________ times per day.  STRETCH - Low Trunk Rotation   Lie on a firm bed or floor. Keeping your legs in front of you, bend your knees so they are both pointed toward the ceiling and your feet are flat on the floor.  Extend your arms out to the side. This will stabilize your upper body by keeping your shoulders in contact with the floor.  Gently and slowly drop both knees together to one side until   you feel a gentle stretch in your low back. Hold for __________ seconds.  Tense your stomach muscles to support your low back as you bring your knees back to the starting position. Repeat the exercise to the other side. Repeat __________ times. Complete this exercise __________ times per day  EXTENSION RANGE OF MOTION AND FLEXIBILITY EXERCISES: STRETCH - Extension, Prone on Elbows  Lie on your stomach on the floor, a bed will be too soft. Place your palms about shoulder  width apart and at the height of your head.  Place your elbows under your shoulders. If this is too painful, stack pillows under your chest.  Allow your body to relax so that your hips drop lower and make contact more completely with the floor.  Hold this position for __________ seconds.  Slowly return to lying flat on the floor. Repeat __________ times. Complete this exercise __________ times per day.  RANGE OF MOTION - Extension, Prone Press Ups  Lie on your stomach on the floor, a bed will be too soft. Place your palms about shoulder width apart and at the height of your head.  Keeping your back as relaxed as possible, slowly straighten your elbows while keeping your hips on the floor. You may adjust the placement of your hands to maximize your comfort. As you gain motion, your hands will come more underneath your shoulders.  Hold this position __________ seconds.  Slowly return to lying flat on the floor. Repeat __________ times. Complete this exercise __________ times per day.  STRENGTHENING EXERCISES - Sciatica  These exercises may help you when beginning to rehabilitate your injury. These exercises should be done near your "sweet spot." This is the neutral, low-back arch, somewhere between fully rounded and fully arched, that is your least painful position. When performed in this safe range of motion, these exercises can be used for people who have either a flexion or extension based injury. These exercises may resolve your symptoms with or without further involvement from your physician, physical therapist or athletic trainer. While completing these exercises, remember:   Muscles can gain both the endurance and the strength needed for everyday activities through controlled exercises.  Complete these exercises as instructed by your physician, physical therapist or athletic trainer. Progress with the resistance and repetition exercises only as your caregiver advises.  You may  experience muscle soreness or fatigue, but the pain or discomfort you are trying to eliminate should never worsen during these exercises. If this pain does worsen, stop and make certain you are following the directions exactly. If the pain is still present after adjustments, discontinue the exercise until you can discuss the trouble with your clinician. STRENGTHENING - Deep Abdominals, Pelvic Tilt   Lie on a firm bed or floor. Keeping your legs in front of you, bend your knees so they are both pointed toward the ceiling and your feet are flat on the floor.  Tense your lower abdominal muscles to press your low back into the floor. This motion will rotate your pelvis so that your tail bone is scooping upwards rather than pointing at your feet or into the floor.  With a gentle tension and even breathing, hold this position for __________ seconds. Repeat __________ times. Complete this exercise __________ times per day.  STRENGTHENING - Abdominals, Crunches   Lie on a firm bed or floor. Keeping your legs in front of you, bend your knees so they are both pointed toward the ceiling and your feet are flat on the   floor. Cross your arms over your chest.  Slightly tip your chin down without bending your neck.  Tense your abdominals and slowly lift your trunk high enough to just clear your shoulder blades. Lifting higher can put excessive stress on the low back and does not further strengthen your abdominal muscles.  Control your return to the starting position. Repeat __________ times. Complete this exercise __________ times per day.  STRENGTHENING - Quadruped, Opposite UE/LE Lift  Assume a hands and knees position on a firm surface. Keep your hands under your shoulders and your knees under your hips. You may place padding under your knees for comfort.  Find your neutral spine and gently tense your abdominal muscles so that you can maintain this position. Your shoulders and hips should form a rectangle  that is parallel with the floor and is not twisted.  Keeping your trunk steady, lift your right hand no higher than your shoulder and then your left leg no higher than your hip. Make sure you are not holding your breath. Hold this position __________ seconds.  Continuing to keep your abdominal muscles tense and your back steady, slowly return to your starting position. Repeat with the opposite arm and leg. Repeat __________ times. Complete this exercise __________ times per day.  STRENGTHENING - Abdominals and Quadriceps, Straight Leg Raise   Lie on a firm bed or floor with both legs extended in front of you.  Keeping one leg in contact with the floor, bend the other knee so that your foot can rest flat on the floor.  Find your neutral spine, and tense your abdominal muscles to maintain your spinal position throughout the exercise.  Slowly lift your straight leg off the floor about 6 inches for a count of 15, making sure to not hold your breath.  Still keeping your neutral spine, slowly lower your leg all the way to the floor. Repeat this exercise with each leg __________ times. Complete this exercise __________ times per day. POSTURE AND BODY MECHANICS CONSIDERATIONS - Sciatica Keeping correct posture when sitting, standing or completing your activities will reduce the stress put on different body tissues, allowing injured tissues a chance to heal and limiting painful experiences. The following are general guidelines for improved posture. Your physician or physical therapist will provide you with any instructions specific to your needs. While reading these guidelines, remember:  The exercises prescribed by your provider will help you have the flexibility and strength to maintain correct postures.  The correct posture provides the optimal environment for your joints to work. All of your joints have less wear and tear when properly supported by a spine with good posture. This means you will  experience a healthier, less painful body.  Correct posture must be practiced with all of your activities, especially prolonged sitting and standing. Correct posture is as important when doing repetitive low-stress activities (typing) as it is when doing a single heavy-load activity (lifting). RESTING POSITIONS Consider which positions are most painful for you when choosing a resting position. If you have pain with flexion-based activities (sitting, bending, stooping, squatting), choose a position that allows you to rest in a less flexed posture. You would want to avoid curling into a fetal position on your side. If your pain worsens with extension-based activities (prolonged standing, working overhead), avoid resting in an extended position such as sleeping on your stomach. Most people will find more comfort when they rest with their spine in a more neutral position, neither too rounded nor too   arched. Lying on a non-sagging bed on your side with a pillow between your knees, or on your back with a pillow under your knees will often provide some relief. Keep in mind, being in any one position for a prolonged period of time, no matter how correct your posture, can still lead to stiffness. PROPER SITTING POSTURE In order to minimize stress and discomfort on your spine, you must sit with correct posture Sitting with good posture should be effortless for a healthy body. Returning to good posture is a gradual process. Many people can work toward this most comfortably by using various supports until they have the flexibility and strength to maintain this posture on their own. When sitting with proper posture, your ears will fall over your shoulders and your shoulders will fall over your hips. You should use the back of the chair to support your upper back. Your low back will be in a neutral position, just slightly arched. You may place a small pillow or folded towel at the base of your low back for support.  When  working at a desk, create an environment that supports good, upright posture. Without extra support, muscles fatigue and lead to excessive strain on joints and other tissues. Keep these recommendations in mind: CHAIR:   A chair should be able to slide under your desk when your back makes contact with the back of the chair. This allows you to work closely.  The chair's height should allow your eyes to be level with the upper part of your monitor and your hands to be slightly lower than your elbows. BODY POSITION  Your feet should make contact with the floor. If this is not possible, use a foot rest.  Keep your ears over your shoulders. This will reduce stress on your neck and low back. INCORRECT SITTING POSTURES   If you are feeling tired and unable to assume a healthy sitting posture, do not slouch or slump. This puts excessive strain on your back tissues, causing more damage and pain. Healthier options include:  Using more support, like a lumbar pillow.  Switching tasks to something that requires you to be upright or walking.  Talking a brief walk.  Lying down to rest in a neutral-spine position. PROLONGED STANDING WHILE SLIGHTLY LEANING FORWARD  When completing a task that requires you to lean forward while standing in one place for a long time, place either foot up on a stationary 2-4 inch high object to help maintain the best posture. When both feet are on the ground, the low back tends to lose its slight inward curve. If this curve flattens (or becomes too large), then the back and your other joints will experience too much stress, fatigue more quickly and can cause pain.  CORRECT STANDING POSTURES Proper standing posture should be assumed with all daily activities, even if they only take a few moments, like when brushing your teeth. As in sitting, your ears should fall over your shoulders and your shoulders should fall over your hips. You should keep a slight tension in your abdominal  muscles to brace your spine. Your tailbone should point down to the ground, not behind your body, resulting in an over-extended swayback posture.  INCORRECT STANDING POSTURES  Common incorrect standing postures include a forward head, locked knees and/or an excessive swayback. WALKING Walk with an upright posture. Your ears, shoulders and hips should all line-up. PROLONGED ACTIVITY IN A FLEXED POSITION When completing a task that requires you to bend forward   at your waist or lean over a low surface, try to find a way to stabilize 3 of 4 of your limbs. You can place a hand or elbow on your thigh or rest a knee on the surface you are reaching across. This will provide you more stability so that your muscles do not fatigue as quickly. By keeping your knees relaxed, or slightly bent, you will also reduce stress across your low back. CORRECT LIFTING TECHNIQUES DO :   Assume a wide stance. This will provide you more stability and the opportunity to get as close as possible to the object which you are lifting.  Tense your abdominals to brace your spine; then bend at the knees and hips. Keeping your back locked in a neutral-spine position, lift using your leg muscles. Lift with your legs, keeping your back straight.  Test the weight of unknown objects before attempting to lift them.  Try to keep your elbows locked down at your sides in order get the best strength from your shoulders when carrying an object.  Always ask for help when lifting heavy or awkward objects. INCORRECT LIFTING TECHNIQUES DO NOT:   Lock your knees when lifting, even if it is a small object.  Bend and twist. Pivot at your feet or move your feet when needing to change directions.  Assume that you cannot safely pick up a paperclip without proper posture.   This information is not intended to replace advice given to you by your health care provider. Make sure you discuss any questions you have with your health care provider.     Document Released: 06/03/2005 Document Revised: 10/18/2014 Document Reviewed: 09/15/2008 [employer] Interactive Patient Education 2016 [employer]  

## 2016-06-27 ENCOUNTER — Ambulatory Visit: Payer: BLUE CROSS/BLUE SHIELD | Admitting: Family Medicine

## 2016-09-25 DIAGNOSIS — M9903 Segmental and somatic dysfunction of lumbar region: Secondary | ICD-10-CM | POA: Diagnosis not present

## 2016-09-25 DIAGNOSIS — M5386 Other specified dorsopathies, lumbar region: Secondary | ICD-10-CM | POA: Diagnosis not present

## 2016-10-15 DIAGNOSIS — M9903 Segmental and somatic dysfunction of lumbar region: Secondary | ICD-10-CM | POA: Diagnosis not present

## 2016-10-15 DIAGNOSIS — M5386 Other specified dorsopathies, lumbar region: Secondary | ICD-10-CM | POA: Diagnosis not present

## 2016-10-22 DIAGNOSIS — M9903 Segmental and somatic dysfunction of lumbar region: Secondary | ICD-10-CM | POA: Diagnosis not present

## 2016-10-22 DIAGNOSIS — M5386 Other specified dorsopathies, lumbar region: Secondary | ICD-10-CM | POA: Diagnosis not present

## 2016-10-30 DIAGNOSIS — M5386 Other specified dorsopathies, lumbar region: Secondary | ICD-10-CM | POA: Diagnosis not present

## 2016-10-30 DIAGNOSIS — M9903 Segmental and somatic dysfunction of lumbar region: Secondary | ICD-10-CM | POA: Diagnosis not present

## 2016-11-20 DIAGNOSIS — M5386 Other specified dorsopathies, lumbar region: Secondary | ICD-10-CM | POA: Diagnosis not present

## 2016-11-20 DIAGNOSIS — M9903 Segmental and somatic dysfunction of lumbar region: Secondary | ICD-10-CM | POA: Diagnosis not present

## 2016-11-27 DIAGNOSIS — M9903 Segmental and somatic dysfunction of lumbar region: Secondary | ICD-10-CM | POA: Diagnosis not present

## 2016-11-27 DIAGNOSIS — M5386 Other specified dorsopathies, lumbar region: Secondary | ICD-10-CM | POA: Diagnosis not present

## 2017-01-27 ENCOUNTER — Ambulatory Visit (INDEPENDENT_AMBULATORY_CARE_PROVIDER_SITE_OTHER): Payer: BLUE CROSS/BLUE SHIELD | Admitting: Unknown Physician Specialty

## 2017-01-27 ENCOUNTER — Encounter: Payer: Self-pay | Admitting: Unknown Physician Specialty

## 2017-01-27 ENCOUNTER — Ambulatory Visit
Admission: RE | Admit: 2017-01-27 | Discharge: 2017-01-27 | Disposition: A | Discharge status: Home / Self Care | Payer: BLUE CROSS/BLUE SHIELD | Source: Ambulatory Visit | Attending: Unknown Physician Specialty | Admitting: Unknown Physician Specialty

## 2017-01-27 ENCOUNTER — Telehealth: Payer: Self-pay | Admitting: Unknown Physician Specialty

## 2017-01-27 VITALS — BP 129/84 | HR 97 | Temp 99.2°F | Ht 65.0 in | Wt 177.1 lb

## 2017-01-27 DIAGNOSIS — R0602 Shortness of breath: Secondary | ICD-10-CM | POA: Diagnosis not present

## 2017-01-27 DIAGNOSIS — R05 Cough: Secondary | ICD-10-CM | POA: Diagnosis not present

## 2017-01-27 DIAGNOSIS — R059 Cough, unspecified: Secondary | ICD-10-CM

## 2017-01-27 DIAGNOSIS — R0689 Other abnormalities of breathing: Secondary | ICD-10-CM | POA: Insufficient documentation

## 2017-01-27 DIAGNOSIS — J069 Acute upper respiratory infection, unspecified: Secondary | ICD-10-CM | POA: Diagnosis not present

## 2017-01-27 MED ORDER — ALBUTEROL SULFATE HFA 108 (90 BASE) MCG/ACT IN AERS: 2.0000 | INHALATION_SPRAY | Freq: Four times a day (QID) | RESPIRATORY_TRACT | 2 refills | Status: DC | PRN

## 2017-01-27 MED ORDER — HYDROCOD POLST-CPM POLST ER 10-8 MG/5ML PO SUER: 5.0000 mL | Freq: Two times a day (BID) | ORAL | 0 refills | Status: DC | PRN

## 2017-01-27 MED ORDER — AZITHROMYCIN 250 MG PO TABS
ORAL_TABLET | ORAL | 0 refills | Status: DC
Start: 2017-01-27 — End: 2017-08-14

## 2017-01-27 NOTE — Telephone Encounter (Signed)
Called about results of x-ray.  Encouraged inhaler use.

## 2017-01-27 NOTE — Progress Notes (Signed)
BP 129/84   Pulse 97   Temp 99.2 F (37.3 C)   Ht 5\' 5"  (1.651 m)   Wt 177 lb 1.6 oz (80.3 kg)   LMP 01/25/2017 (Approximate)   SpO2 98%   BMI 29.47 kg/m    Subjective:    Patient ID: Danielle FilesMari Baughn Lecomte, female    DOB: 07-Jul-1985, 31 y.o.   MRN: [id number]  HPI: Danielle Pearson is a 31 y.o. female  Chief Complaint  Patient presents with  . URI    pt states she has had a headache, wheezing, chest congestion and a cough for about 8 days. States she has tried multiple OTC medications but nothing has helped   URI   This is a new problem. The current episode started in the past 7 days (Started one week ago.  unable to shake). The problem has been unchanged. There has been no fever. Associated symptoms include congestion, coughing, headaches, rhinorrhea and wheezing. Pertinent negatives include no chest pain. She has tried decongestant, antihistamine, eating and steam for the symptoms. The treatment provided no relief.  Cough  The current episode started in the past 7 days. The problem has been gradually worsening. The problem occurs constantly. The cough is non-productive. Associated symptoms include headaches, nasal congestion, rhinorrhea and wheezing. Pertinent negatives include no chest pain or sweats. The symptoms are aggravated by lying down. Risk factors for lung disease include smoking/tobacco exposure. She has tried OTC cough suppressant for the symptoms. The treatment provided no relief. Her past medical history is significant for asthma.   Relevant past medical, surgical, family and social history reviewed and updated as indicated. Interim medical history since our last visit reviewed. Allergies and medications reviewed and updated.  Review of Systems  HENT: Positive for congestion and rhinorrhea.   Respiratory: Positive for cough and wheezing.   Cardiovascular: Negative for chest pain.  Neurological: Positive for headaches.    Per HPI unless specifically indicated  above     Objective:    BP 129/84   Pulse 97   Temp 99.2 F (37.3 C)   Ht 5\' 5"  (1.651 m)   Wt 177 lb 1.6 oz (80.3 kg)   LMP 01/25/2017 (Approximate)   SpO2 98%   BMI 29.47 kg/m   Wt Readings from Last 3 Encounters:  01/27/17 177 lb 1.6 oz (80.3 kg)  01/29/16 164 lb (74.4 kg)  03/17/15 162 lb (73.5 kg)    Physical Exam  Constitutional: She is oriented to person, place, and time. She appears well-developed and well-nourished. No distress.  HENT:  Head: Normocephalic and atraumatic.  Right Ear: Tympanic membrane and ear canal normal.  Left Ear: Tympanic membrane and ear canal normal.  Nose: Rhinorrhea present. Right sinus exhibits no maxillary sinus tenderness and no frontal sinus tenderness. Left sinus exhibits no maxillary sinus tenderness and no frontal sinus tenderness.  Mouth/Throat: Mucous membranes are normal. Posterior oropharyngeal erythema present.  Eyes: Conjunctivae and lids are normal. Right eye exhibits no discharge. Left eye exhibits no discharge. No scleral icterus.  Cardiovascular: Normal rate and regular rhythm.   Pulmonary/Chest: Effort normal. No respiratory distress. She has no decreased breath sounds. She has rhonchi in the right lower field.  Abdominal: Normal appearance. There is no splenomegaly or hepatomegaly.  Musculoskeletal: Normal range of motion.  Neurological: She is alert and oriented to person, place, and time.  Skin: Skin is intact. No rash noted. No pallor.  Psychiatric: She has a normal mood and affect. Her behavior is  normal. Judgment and thought content normal.    Results for orders placed or performed in visit on 01/29/16  LP+Glucose Piccolo, [employer]  Result Value Ref Range   Glucose Piccolo, Waived 94 73 - 118 mg/dL   Cholesterol Piccolo, Waived 157 <200 mg/dL   HDL Chol Piccolo, Waived 52 (L) >59 mg/dL   Triglycerides Piccolo,Waived 153 (H) <150 mg/dL   Chol/HDL Ratio Piccolo,Waive 3.0 mg/dL   LDL Chol Calc Piccolo Waived 75 <100  mg/dL   VLDL Chol Calc Piccolo,Waive 31 (H) <30 mg/dL      Assessment & Plan:   Problem List Items Addressed This Visit    None    Visit Diagnoses    Cough    -  Primary   tx underlying cause.  Albuterol for wheezing and cough.  Rx for Tussionex at night prn   Relevant Orders   DG Chest 2 View   Viral upper respiratory tract infection       Relevant Medications   azithromycin (ZITHROMAX Z-PAK) 250 MG tablet   Adventitious breath sounds       Pneumonia vs Atelectisis.  Will get chest x-ray   Relevant Orders   DG Chest 2 View       Follow up plan: Return for f/u x-ray results.

## 2017-01-30 ENCOUNTER — Telehealth: Payer: Self-pay | Admitting: Unknown Physician Specialty

## 2017-01-30 ENCOUNTER — Telehealth: Payer: Self-pay | Admitting: Family Medicine

## 2017-01-30 NOTE — Telephone Encounter (Signed)
Patient called in regards to not feeling nay better since her last appointment 01/27/2017.  Patient was interestedin taking the medication pretizone discussed during visit.  Please Advise.  Thank you

## 2017-01-30 NOTE — Telephone Encounter (Signed)
Entered in error

## 2017-01-30 NOTE — Telephone Encounter (Signed)
Routing to provider. Patient states she is interested in taking prednisone.

## 2017-01-31 MED ORDER — PREDNISONE 20 MG PO TABS: 40.0000 mg | ORAL_TABLET | Freq: Every day | ORAL | 0 refills | Status: DC

## 2017-01-31 NOTE — Telephone Encounter (Signed)
OK--Will send.

## 2017-01-31 NOTE — Telephone Encounter (Signed)
Called and left patient a VM (not detailed) letting her know that her prescription has been sent in for her.

## 2017-04-07 ENCOUNTER — Ambulatory Visit (INDEPENDENT_AMBULATORY_CARE_PROVIDER_SITE_OTHER): Payer: BLUE CROSS/BLUE SHIELD | Admitting: Obstetrics and Gynecology

## 2017-04-07 VITALS — BP 110/68 | HR 80 | Ht 64.0 in | Wt 176.0 lb

## 2017-04-07 DIAGNOSIS — Z124 Encounter for screening for malignant neoplasm of cervix: Secondary | ICD-10-CM | POA: Diagnosis not present

## 2017-04-07 DIAGNOSIS — Z720 Tobacco use: Secondary | ICD-10-CM | POA: Diagnosis not present

## 2017-04-07 DIAGNOSIS — Z01419 Encounter for gynecological examination (general) (routine) without abnormal findings: Secondary | ICD-10-CM | POA: Diagnosis not present

## 2017-04-07 NOTE — Progress Notes (Signed)
PCP:  [name], DO   Chief Complaint  Patient presents with  . Gynecologic Exam    No Complaints/Last pap 03/11/16  -/-     HPI:      [name] is a 31 y.o. No obstetric history on file. who LMP was Patient's last menstrual period was 03/10/2017., presents today for her annual examination.  Her menses are regular every 28-30 days, lasting 1-3 days.  Dysmenorrhea mild, occurring first 1-2 days of flow. She does not have intermenstrual bleeding.  Sex activity: single partner, contraception - coitus interruptus. Declines other BC Last Pap: March 11, 2016  Results were: no abnormalities /neg HPV DNA . Pt needs pap for insurance purposes.  Hx of STDs: none  There is no FH of breast cancer. There is no FH of ovarian cancer. The patient does not do self-breast exams.  Tobacco use:1/2 ppd; may quit eventually Alcohol use: none No drug use.  Exercise: occas active  She does get adequate calcium but not Vitamin D in her diet.   Past Medical History:  Diagnosis Date  . Tobacco abuse     Past Surgical History:  Procedure Laterality Date  . WISDOM TOOTH EXTRACTION      Family History  Problem Relation Age of Onset  . Hypertension Father     Social History   Social History  . Marital status: Married    Spouse name: N/A  . Number of children: N/A  . Years of education: some colle   Occupational History  . Bank Teller    Social History Main Topics  . Smoking status: Current Every Day Smoker    Packs/day: 0.50    Types: Cigarettes  . Smokeless tobacco: Never Used  . Alcohol use 0.0 oz/week     Comment: Socially  . Drug use: No  . Sexual activity: Yes    Birth control/ protection: IUD   Other Topics Concern  . Not on file   Social History Narrative  . No narrative on file    No outpatient prescriptions have been marked as taking for the 04/07/17 encounter (Office Visit) with Wandra Babin, Ilona Sorrel, PA-C.     ROS:  Review of Systems    Constitutional: Negative for fatigue, fever and unexpected weight change.  Respiratory: Negative for cough, shortness of breath and wheezing.   Cardiovascular: Negative for chest pain, palpitations and leg swelling.  Gastrointestinal: Negative for blood in stool, constipation, diarrhea, nausea and vomiting.  Endocrine: Negative for cold intolerance, heat intolerance and polyuria.  Genitourinary: Negative for dyspareunia, dysuria, flank pain, frequency, genital sores, hematuria, menstrual problem, pelvic pain, urgency, vaginal bleeding, vaginal discharge and vaginal pain.  Musculoskeletal: Negative for back pain, joint swelling and myalgias.  Skin: Negative for rash.  Neurological: Negative for dizziness, syncope, light-headedness, numbness and headaches.  Hematological: Negative for adenopathy.  Psychiatric/Behavioral: Negative for agitation, confusion, sleep disturbance and suicidal ideas. The patient is not nervous/anxious.      Objective: BP 110/68 (BP Location: Left Arm, Patient Position: Sitting, Cuff Size: Normal)   Pulse 80   Ht 5\' 4"  (1.626 m)   Wt 176 lb (79.8 kg)   LMP 03/10/2017   BMI 30.21 kg/m    Physical Exam  Constitutional: She is oriented to person, place, and time. She appears well-developed and well-nourished.  Genitourinary: Vagina normal and uterus normal. There is no rash or tenderness on the right labia. There is no rash or tenderness on the left labia. No erythema or  tenderness in the vagina. No vaginal discharge found. Right adnexum does not display mass and does not display tenderness. Left adnexum does not display mass and does not display tenderness. Cervix does not exhibit motion tenderness or polyp. Uterus is not enlarged or tender.  Neck: Normal range of motion. No thyromegaly present.  Cardiovascular: Normal rate, regular rhythm and normal heart sounds.   No murmur heard. Pulmonary/Chest: Effort normal and breath sounds normal. Right breast exhibits no  mass, no nipple discharge, no skin change and no tenderness. Left breast exhibits no mass, no nipple discharge, no skin change and no tenderness.  Abdominal: Soft. There is no tenderness. There is no guarding.  Musculoskeletal: Normal range of motion.  Neurological: She is alert and oriented to person, place, and time. No cranial nerve deficit.  Psychiatric: She has a normal mood and affect. Her behavior is normal.  Vitals reviewed.   Assessment/Plan: Encounter for annual routine gynecological examination  Cervical cancer screening - Plan: Pap IG (Image Guided)  Tobacco abuse - Pt will quit eventually but not ready now.  GYN counsel adequate intake of calcium and vitamin D, diet and exercise     F/U  Return in about 1 year (around 04/07/2018).  Budd Freiermuth B. Arvil Utz, PA-C 04/07/2017 1:50 PM

## 2017-04-09 LAB — PAP IG (IMAGE GUIDED): PAP Smear Comment: 0

## 2017-07-06 DIAGNOSIS — L03317 Cellulitis of buttock: Secondary | ICD-10-CM | POA: Diagnosis not present

## 2017-08-14 ENCOUNTER — Ambulatory Visit (INDEPENDENT_AMBULATORY_CARE_PROVIDER_SITE_OTHER): Payer: BLUE CROSS/BLUE SHIELD | Admitting: Family Medicine

## 2017-08-14 ENCOUNTER — Encounter: Payer: Self-pay | Admitting: Family Medicine

## 2017-08-14 VITALS — BP 129/85 | HR 99 | Temp 99.1°F | Wt 165.1 lb

## 2017-08-14 DIAGNOSIS — R21 Rash and other nonspecific skin eruption: Secondary | ICD-10-CM

## 2017-08-14 DIAGNOSIS — Z23 Encounter for immunization: Secondary | ICD-10-CM

## 2017-08-14 DIAGNOSIS — Z7189 Other specified counseling: Secondary | ICD-10-CM

## 2017-08-14 DIAGNOSIS — L739 Follicular disorder, unspecified: Secondary | ICD-10-CM

## 2017-08-14 DIAGNOSIS — H6121 Impacted cerumen, right ear: Secondary | ICD-10-CM

## 2017-08-14 DIAGNOSIS — Z7184 Encounter for health counseling related to travel: Secondary | ICD-10-CM

## 2017-08-14 MED ORDER — CLOTRIMAZOLE-BETAMETHASONE 1-0.05 % EX CREA: 1.0000 "application " | TOPICAL_CREAM | Freq: Two times a day (BID) | CUTANEOUS | 0 refills | Status: DC

## 2017-08-14 MED ORDER — MUPIROCIN 2 % EX OINT: 1.0000 "application " | TOPICAL_OINTMENT | Freq: Two times a day (BID) | CUTANEOUS | 0 refills | Status: DC

## 2017-08-14 NOTE — Patient Instructions (Signed)
Debrox - ear wax softener

## 2017-08-14 NOTE — Progress Notes (Signed)
BP 129/85 (BP Location: Left Arm, Patient Position: Sitting, Cuff Size: Normal)   Pulse 99   Temp 99.1 F (37.3 C)   Wt 165 lb 2 oz (74.9 kg)   SpO2 100%   BMI 28.34 kg/m    Subjective:    Patient ID: Danielle Pearson, female    DOB: 04/29/1986, 32 y.o.   MRN: [id number]  HPI: Danielle Pearson is a 32 y.o. female  Chief Complaint  Patient presents with  . Abscess    lower buttucks, went to urgent  care 3 weeks ago, was opened but still feels like there is something in it  . Rash    inner thigh and upper back, comes and goes, itches really bad when it flares up  . Hearing Loss    right ear, patient thinks that it may be blocked   SKIN INFECTION Duration: 12 weeks, has been drained about 3 weeks ago, never totally went away Location: lower buttocks History of trauma in area: no Pain: no Quality: itchy Severity: moderate Redness: yes Swelling: yes Oozing: no Pus: no Fevers: no Nausea/vomiting: no Status: stable Treatments attempted:sitz and warm compresses  Tetanus: due in September  RASH Duration:  chronic  Location: inner thigh and upper back  Itching: yes Burning: yes Redness: yes Oozing: no Scaling: yes Blisters: no Painful: no Fevers: no Change in detergents/soaps/personal care products: no Recent illness: no Recent travel:no History of same: yes Context: fluctuating Alleviating factors: goats milk, teatree oil, charcoal, lamasil, hydrocortisone cream, benadryl and lotion/moisturizer Treatments attempted: goats milk, teatree oil, charcoal, lamasil, hydrocortisone cream, benadryl and lotion/moisturizer Shortness of breath: no  Throat/tongue swelling: no Myalgias/arthralgias: no  EAG CLOGGED Duration: weeks Involved ear(s):  "right Sensation of feeling clogged/plugged: yes Decreased/muffled hearing:yes Ear pain: no Fever: no Otorrhea: no Hearing loss: yes Upper respiratory infection symptoms: no Using Q-Tips: yes Status: worse History  of cerumenosis: no Treatments attempted: none  Relevant past medical, surgical, family and social history reviewed and updated as indicated. Interim medical history since our last visit reviewed. Allergies and medications reviewed and updated.  Review of Systems  Constitutional: Negative.   HENT: Positive for hearing loss. Negative for congestion, dental problem, drooling, ear discharge, ear pain, facial swelling, mouth sores, nosebleeds, postnasal drip, rhinorrhea, sinus pressure, sinus pain, sneezing, sore throat, tinnitus, trouble swallowing and voice change.   Respiratory: Negative.   Cardiovascular: Negative.   Skin: Positive for rash and wound. Negative for color change and pallor.  Psychiatric/Behavioral: Negative.     Per HPI unless specifically indicated above     Objective:    BP 129/85 (BP Location: Left Arm, Patient Position: Sitting, Cuff Size: Normal)   Pulse 99   Temp 99.1 F (37.3 C)   Wt 165 lb 2 oz (74.9 kg)   SpO2 100%   BMI 28.34 kg/m   Wt Readings from Last 3 Encounters:  08/14/17 165 lb 2 oz (74.9 kg)  04/07/17 176 lb (79.8 kg)  01/27/17 177 lb 1.6 oz (80.3 kg)    Physical Exam  Constitutional: She is oriented to person, place, and time. She appears well-developed and well-nourished. No distress.  HENT:  Head: Normocephalic and atraumatic.  Right Ear: Hearing normal.  Left Ear: Hearing and external ear normal.  Nose: Nose normal.  Mouth/Throat: Oropharynx is clear and moist. No oropharyngeal exudate.  Cerumen impaction R ear  Eyes: Conjunctivae, EOM and lids are normal. Pupils are equal, round, and reactive to light. Right eye exhibits no discharge. Left  eye exhibits no discharge. No scleral icterus.  Neck: Normal range of motion. Neck supple. No JVD present. No tracheal deviation present. No thyromegaly present.  Cardiovascular: Normal rate, regular rhythm, normal heart sounds and intact distal pulses. Exam reveals no gallop and no friction rub.  No  murmur heard. Pulmonary/Chest: Effort normal and breath sounds normal. No stridor. No respiratory distress. She has no wheezes. She has no rales. She exhibits no tenderness.  Musculoskeletal: Normal range of motion.  Lymphadenopathy:    She has no cervical adenopathy.  Neurological: She is alert and oriented to person, place, and time.  Skin: Skin is warm, dry and intact. Rash noted. She is not diaphoretic. No erythema. No pallor.  Small lesion on R buttock, fine erythematous rash on back and thigh, scar tissue nodule on L buttock- no sign of abscess  Psychiatric: She has a normal mood and affect. Her speech is normal and behavior is normal. Judgment and thought content normal. Cognition and memory are normal.  Nursing note and vitals reviewed.   Results for orders placed or performed in visit on 04/07/17  Pap IG (Image Guided)  Result Value Ref Range   DIAGNOSIS: Comment    Specimen adequacy: Comment    Clinician Provided ICD10 Comment    Performed by: Comment    QC reviewed by: Comment    PAP Smear Comment .    Note: Comment    Test Methodology Comment       Assessment & Plan:   Problem List Items Addressed This Visit    None    Visit Diagnoses    Immunization due    -  Primary   Tdap given today.   Relevant Orders   Tdap vaccine greater than or equal to 7yo IM   Folliculitis       Will treat with mupicin ointment. Call if not getting better or getting worse.    Travel advice encounter       Going to [place]- tdap updated. Will check on hep A status, will give vaccines before she leaves if needed.    Rash       Will treat with lortisone, if not better, call.    Impacted cerumen of right ear       Will treat with debrox, if not better, return for ear flushing.        Follow up plan: Return if symptoms worsen or fail to improve.

## 2017-08-21 ENCOUNTER — Ambulatory Visit (INDEPENDENT_AMBULATORY_CARE_PROVIDER_SITE_OTHER): Payer: BLUE CROSS/BLUE SHIELD | Admitting: Family Medicine

## 2017-08-21 ENCOUNTER — Encounter: Payer: Self-pay | Admitting: Family Medicine

## 2017-08-21 VITALS — BP 134/84 | HR 79 | Temp 98.8°F | Wt 165.3 lb

## 2017-08-21 DIAGNOSIS — H6121 Impacted cerumen, right ear: Secondary | ICD-10-CM

## 2017-08-21 NOTE — Progress Notes (Signed)
BP 134/84   Pulse 79   Temp 98.8 F (37.1 C) (Oral)   Wt 165 lb 4.8 oz (75 kg)   SpO2 99%   BMI 28.37 kg/m    Subjective:    Patient ID: Danielle Pearson, female    DOB: 10-06-1985, 32 y.o.   MRN: [id number]  HPI: Danielle Pearson is a 32 y.o. female  Chief Complaint  Patient presents with  . Ear Pain    pt states she was told to use debrox last week for right ear. States ear has gotten worse with pain and now she cannot hear out of it   Pt here today following up on right ear fullness from last week, now with intermittent sharp pains and complete muffled hearing. Has been using debrox drops daily as recommended but unable to get much out at home. Denies fevers, congestion, headaches, thick drainage from ear. Denies ever having these types of issues in the past.   Relevant past medical, surgical, family and social history reviewed and updated as indicated. Interim medical history since our last visit reviewed. Allergies and medications reviewed and updated.  Review of Systems  Per HPI unless specifically indicated above     Objective:    BP 134/84   Pulse 79   Temp 98.8 F (37.1 C) (Oral)   Wt 165 lb 4.8 oz (75 kg)   SpO2 99%   BMI 28.37 kg/m   Wt Readings from Last 3 Encounters:  08/21/17 165 lb 4.8 oz (75 kg)  08/14/17 165 lb 2 oz (74.9 kg)  04/07/17 176 lb (79.8 kg)    Physical Exam  Constitutional: She is oriented to person, place, and time. She appears well-developed and well-nourished. No distress.  HENT:  Head: Atraumatic.  Left Ear: External ear normal.  Right EAC cerumen impaction. Cannot visualize TM  Cardiovascular: Normal rate.  Musculoskeletal: Normal range of motion.  Neurological: She is alert and oriented to person, place, and time.  Skin: Skin is dry.  Psychiatric: She has a normal mood and affect. Her behavior is normal.  Nursing note and vitals reviewed.   Procedure: Right ear lavage.   Procedure reviewed at length with pt as we  could not visualize TM in advance and pt was having pain sxs. Risks of perforation reviewed, pt opting to proceed with gentle lavage. Warm water was used with lavage tool to very gently remove the wax build up. All cerumen was removed, TM was visualized and intact with no evidence of middle ear infection. Procedure well tolerated with no immediate complications.   Results for orders placed or performed in visit on 04/07/17  Pap IG (Image Guided)  Result Value Ref Range   DIAGNOSIS: Comment    Specimen adequacy: Comment    Clinician Provided ICD10 Comment    Performed by: Comment    QC reviewed by: Comment    PAP Smear Comment .    Note: Comment    Test Methodology Comment       Assessment & Plan:   Problem List Items Addressed This Visit    None    Visit Diagnoses    Impacted cerumen of right ear    -  Primary   Risks of lavage and perforation discussed, pt opted to try lavage with good relief after removal of cerumen plug. Continue debrox prn. F/u if recurring sxs       Follow up plan: Return if symptoms worsen or fail to improve.

## 2017-08-24 NOTE — Patient Instructions (Signed)
Follow up as needed

## 2017-10-02 DIAGNOSIS — M5431 Sciatica, right side: Secondary | ICD-10-CM | POA: Diagnosis not present

## 2017-10-02 DIAGNOSIS — M5416 Radiculopathy, lumbar region: Secondary | ICD-10-CM | POA: Diagnosis not present

## 2017-10-02 DIAGNOSIS — M9905 Segmental and somatic dysfunction of pelvic region: Secondary | ICD-10-CM | POA: Diagnosis not present

## 2017-10-02 DIAGNOSIS — M9903 Segmental and somatic dysfunction of lumbar region: Secondary | ICD-10-CM | POA: Diagnosis not present

## 2017-10-06 DIAGNOSIS — M5431 Sciatica, right side: Secondary | ICD-10-CM | POA: Diagnosis not present

## 2017-10-06 DIAGNOSIS — M9905 Segmental and somatic dysfunction of pelvic region: Secondary | ICD-10-CM | POA: Diagnosis not present

## 2017-10-06 DIAGNOSIS — M9903 Segmental and somatic dysfunction of lumbar region: Secondary | ICD-10-CM | POA: Diagnosis not present

## 2017-10-06 DIAGNOSIS — M5416 Radiculopathy, lumbar region: Secondary | ICD-10-CM | POA: Diagnosis not present

## 2017-10-08 DIAGNOSIS — M5431 Sciatica, right side: Secondary | ICD-10-CM | POA: Diagnosis not present

## 2017-10-08 DIAGNOSIS — M9905 Segmental and somatic dysfunction of pelvic region: Secondary | ICD-10-CM | POA: Diagnosis not present

## 2017-10-08 DIAGNOSIS — M5416 Radiculopathy, lumbar region: Secondary | ICD-10-CM | POA: Diagnosis not present

## 2017-10-08 DIAGNOSIS — M9903 Segmental and somatic dysfunction of lumbar region: Secondary | ICD-10-CM | POA: Diagnosis not present

## 2017-10-09 DIAGNOSIS — M9903 Segmental and somatic dysfunction of lumbar region: Secondary | ICD-10-CM | POA: Diagnosis not present

## 2017-10-09 DIAGNOSIS — M5416 Radiculopathy, lumbar region: Secondary | ICD-10-CM | POA: Diagnosis not present

## 2017-10-09 DIAGNOSIS — M9905 Segmental and somatic dysfunction of pelvic region: Secondary | ICD-10-CM | POA: Diagnosis not present

## 2017-10-09 DIAGNOSIS — M5431 Sciatica, right side: Secondary | ICD-10-CM | POA: Diagnosis not present

## 2017-10-14 DIAGNOSIS — M9903 Segmental and somatic dysfunction of lumbar region: Secondary | ICD-10-CM | POA: Diagnosis not present

## 2017-10-14 DIAGNOSIS — M5416 Radiculopathy, lumbar region: Secondary | ICD-10-CM | POA: Diagnosis not present

## 2017-10-14 DIAGNOSIS — M9905 Segmental and somatic dysfunction of pelvic region: Secondary | ICD-10-CM | POA: Diagnosis not present

## 2017-10-14 DIAGNOSIS — M5431 Sciatica, right side: Secondary | ICD-10-CM | POA: Diagnosis not present

## 2017-10-15 DIAGNOSIS — M9903 Segmental and somatic dysfunction of lumbar region: Secondary | ICD-10-CM | POA: Diagnosis not present

## 2017-10-15 DIAGNOSIS — M5416 Radiculopathy, lumbar region: Secondary | ICD-10-CM | POA: Diagnosis not present

## 2017-10-15 DIAGNOSIS — M9905 Segmental and somatic dysfunction of pelvic region: Secondary | ICD-10-CM | POA: Diagnosis not present

## 2017-10-15 DIAGNOSIS — M5431 Sciatica, right side: Secondary | ICD-10-CM | POA: Diagnosis not present

## 2017-10-16 DIAGNOSIS — M9905 Segmental and somatic dysfunction of pelvic region: Secondary | ICD-10-CM | POA: Diagnosis not present

## 2017-10-16 DIAGNOSIS — M5416 Radiculopathy, lumbar region: Secondary | ICD-10-CM | POA: Diagnosis not present

## 2017-10-16 DIAGNOSIS — M9903 Segmental and somatic dysfunction of lumbar region: Secondary | ICD-10-CM | POA: Diagnosis not present

## 2017-10-16 DIAGNOSIS — M5431 Sciatica, right side: Secondary | ICD-10-CM | POA: Diagnosis not present

## 2017-10-20 DIAGNOSIS — M5416 Radiculopathy, lumbar region: Secondary | ICD-10-CM | POA: Diagnosis not present

## 2017-10-20 DIAGNOSIS — M9905 Segmental and somatic dysfunction of pelvic region: Secondary | ICD-10-CM | POA: Diagnosis not present

## 2017-10-20 DIAGNOSIS — M5431 Sciatica, right side: Secondary | ICD-10-CM | POA: Diagnosis not present

## 2017-10-20 DIAGNOSIS — M9903 Segmental and somatic dysfunction of lumbar region: Secondary | ICD-10-CM | POA: Diagnosis not present

## 2017-10-22 DIAGNOSIS — M9905 Segmental and somatic dysfunction of pelvic region: Secondary | ICD-10-CM | POA: Diagnosis not present

## 2017-10-22 DIAGNOSIS — M5431 Sciatica, right side: Secondary | ICD-10-CM | POA: Diagnosis not present

## 2017-10-22 DIAGNOSIS — M9903 Segmental and somatic dysfunction of lumbar region: Secondary | ICD-10-CM | POA: Diagnosis not present

## 2017-10-22 DIAGNOSIS — M5416 Radiculopathy, lumbar region: Secondary | ICD-10-CM | POA: Diagnosis not present

## 2017-10-23 DIAGNOSIS — M9905 Segmental and somatic dysfunction of pelvic region: Secondary | ICD-10-CM | POA: Diagnosis not present

## 2017-10-23 DIAGNOSIS — M5416 Radiculopathy, lumbar region: Secondary | ICD-10-CM | POA: Diagnosis not present

## 2017-10-23 DIAGNOSIS — M5431 Sciatica, right side: Secondary | ICD-10-CM | POA: Diagnosis not present

## 2017-10-23 DIAGNOSIS — M9903 Segmental and somatic dysfunction of lumbar region: Secondary | ICD-10-CM | POA: Diagnosis not present

## 2017-10-27 DIAGNOSIS — M5416 Radiculopathy, lumbar region: Secondary | ICD-10-CM | POA: Diagnosis not present

## 2017-10-27 DIAGNOSIS — M5431 Sciatica, right side: Secondary | ICD-10-CM | POA: Diagnosis not present

## 2017-10-27 DIAGNOSIS — M9903 Segmental and somatic dysfunction of lumbar region: Secondary | ICD-10-CM | POA: Diagnosis not present

## 2017-10-27 DIAGNOSIS — M9905 Segmental and somatic dysfunction of pelvic region: Secondary | ICD-10-CM | POA: Diagnosis not present

## 2017-10-29 DIAGNOSIS — M9905 Segmental and somatic dysfunction of pelvic region: Secondary | ICD-10-CM | POA: Diagnosis not present

## 2017-10-29 DIAGNOSIS — M5416 Radiculopathy, lumbar region: Secondary | ICD-10-CM | POA: Diagnosis not present

## 2017-10-29 DIAGNOSIS — M5431 Sciatica, right side: Secondary | ICD-10-CM | POA: Diagnosis not present

## 2017-10-29 DIAGNOSIS — M9903 Segmental and somatic dysfunction of lumbar region: Secondary | ICD-10-CM | POA: Diagnosis not present

## 2017-10-30 DIAGNOSIS — M5416 Radiculopathy, lumbar region: Secondary | ICD-10-CM | POA: Diagnosis not present

## 2017-10-30 DIAGNOSIS — M5431 Sciatica, right side: Secondary | ICD-10-CM | POA: Diagnosis not present

## 2017-10-30 DIAGNOSIS — M9903 Segmental and somatic dysfunction of lumbar region: Secondary | ICD-10-CM | POA: Diagnosis not present

## 2017-10-30 DIAGNOSIS — M9905 Segmental and somatic dysfunction of pelvic region: Secondary | ICD-10-CM | POA: Diagnosis not present

## 2017-11-03 DIAGNOSIS — M5431 Sciatica, right side: Secondary | ICD-10-CM | POA: Diagnosis not present

## 2017-11-03 DIAGNOSIS — M9903 Segmental and somatic dysfunction of lumbar region: Secondary | ICD-10-CM | POA: Diagnosis not present

## 2017-11-03 DIAGNOSIS — M5416 Radiculopathy, lumbar region: Secondary | ICD-10-CM | POA: Diagnosis not present

## 2017-11-03 DIAGNOSIS — M9905 Segmental and somatic dysfunction of pelvic region: Secondary | ICD-10-CM | POA: Diagnosis not present

## 2017-11-04 IMAGING — DX DG CHEST 2V
3 series · 3 of 3 positions shown · non-contrast
Comparison: None in PACs

CLINICAL DATA: Intermittent productive cough for the past week
associated with shortness of breath. History of asthma, 10 year
history of smoking.

EXAM:
CHEST  2 VIEW

[chest lat]
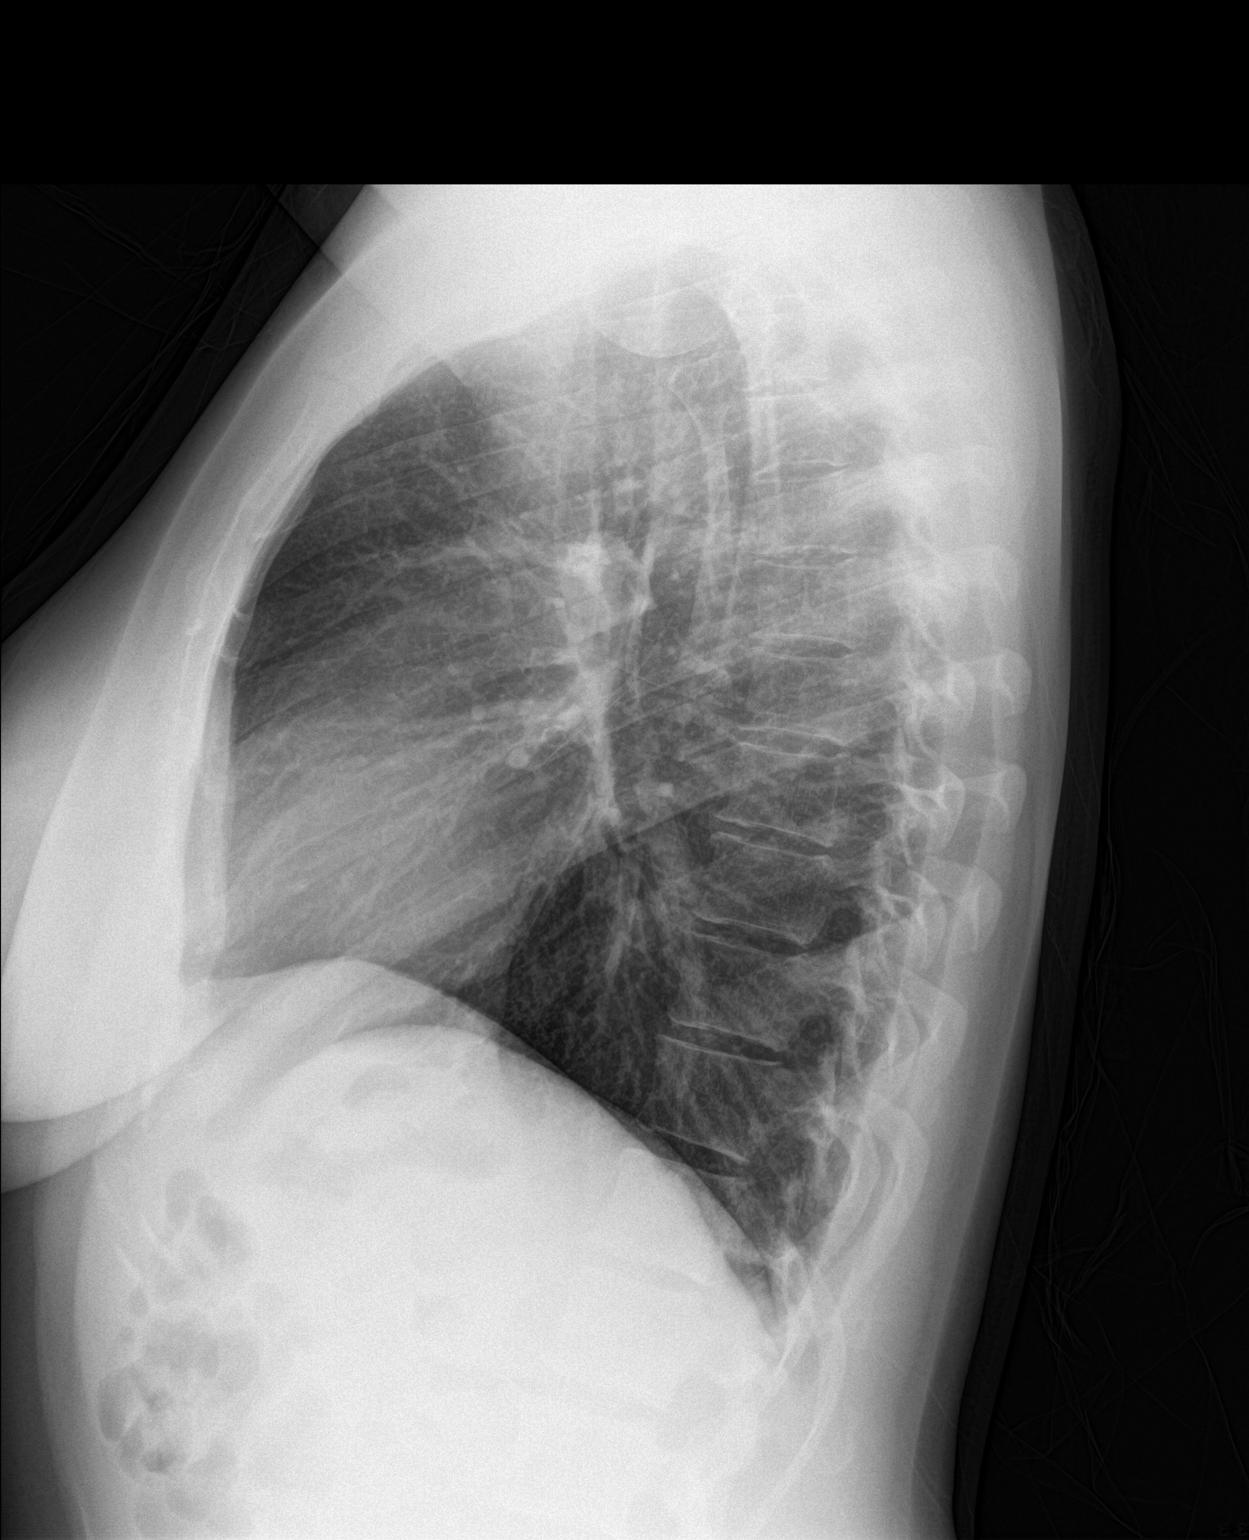

[chest pa (1 of 2)]
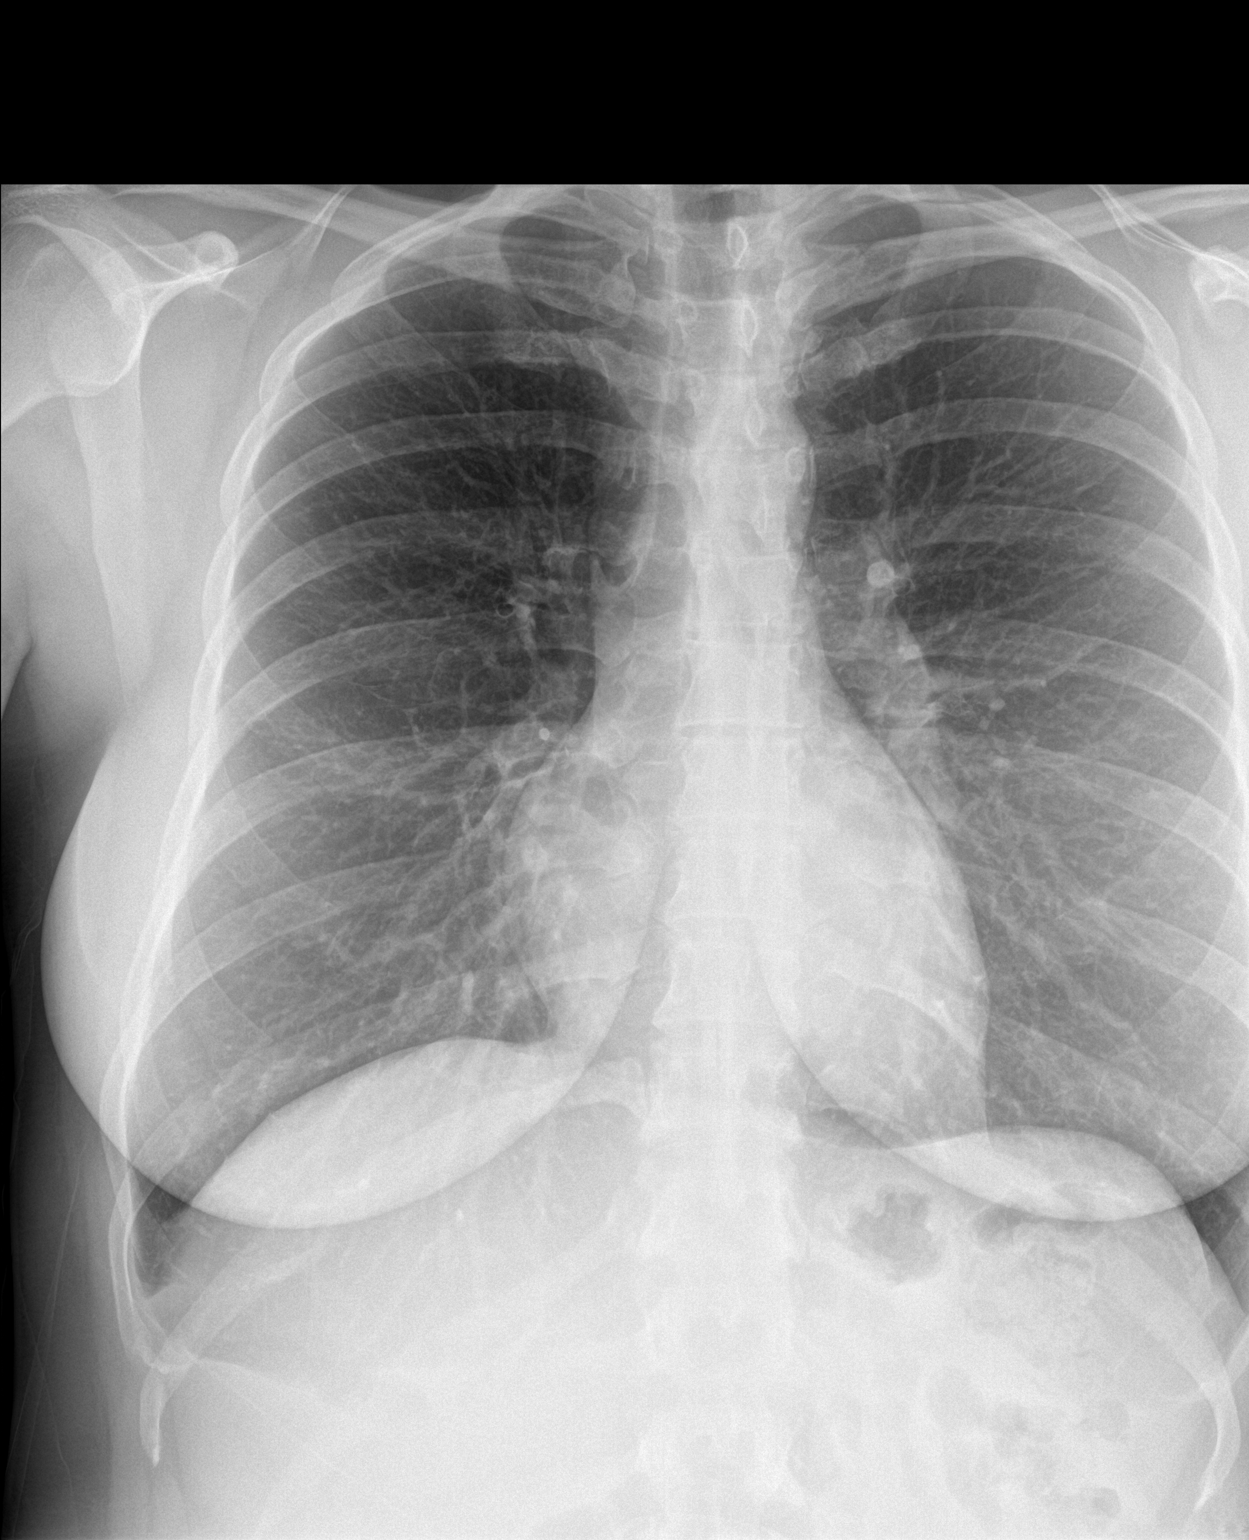

[chest pa (2 of 2)]
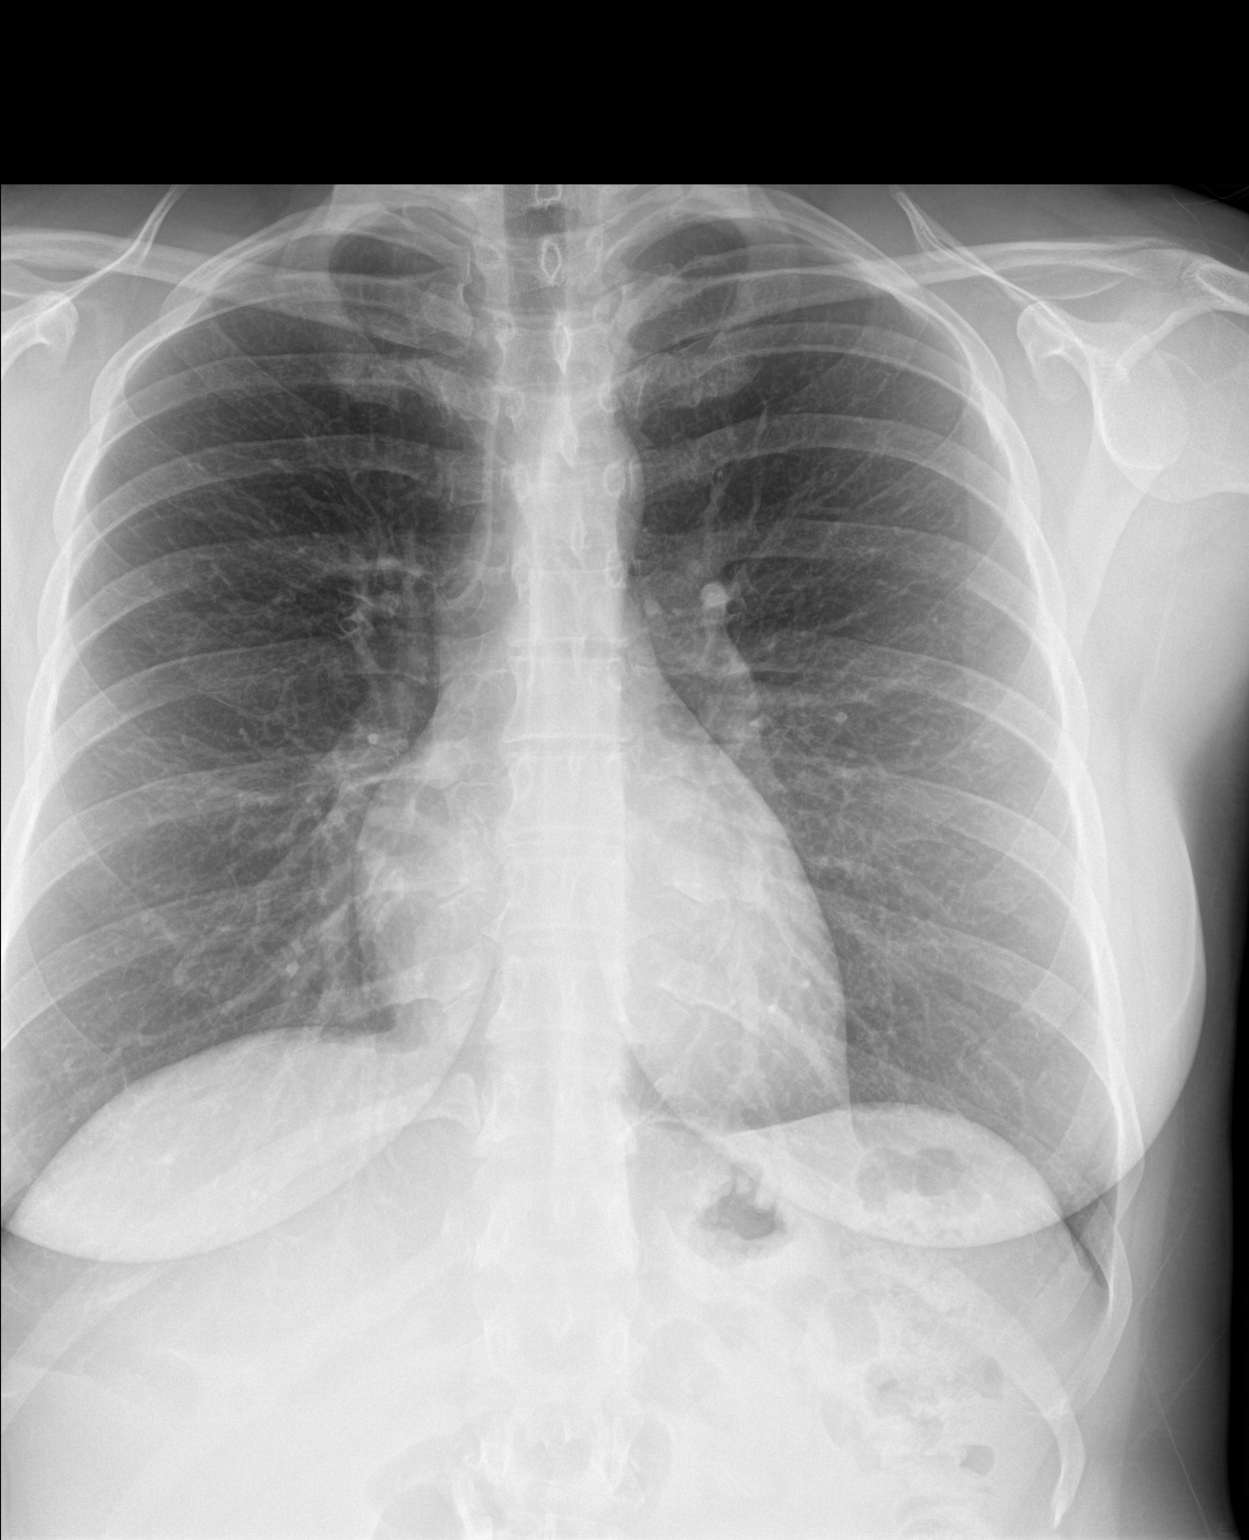

[3 of 3 positions shown; findings below may reference images not displayed]

FINDINGS: The lungs are well-expanded without hemidiaphragm flattening. There
is no focal infiltrate. There is no pleural effusion. The heart and
pulmonary vascularity are normal. The mediastinum is normal in
width. The trachea is midline. There is no pleural effusion. The
bony thorax is unremarkable.
IMPRESSION: There is no pneumonia, CHF, nor other acute cardiopulmonary
abnormality.

## 2017-11-05 DIAGNOSIS — M5431 Sciatica, right side: Secondary | ICD-10-CM | POA: Diagnosis not present

## 2017-11-05 DIAGNOSIS — M9905 Segmental and somatic dysfunction of pelvic region: Secondary | ICD-10-CM | POA: Diagnosis not present

## 2017-11-05 DIAGNOSIS — M9903 Segmental and somatic dysfunction of lumbar region: Secondary | ICD-10-CM | POA: Diagnosis not present

## 2017-11-05 DIAGNOSIS — M5416 Radiculopathy, lumbar region: Secondary | ICD-10-CM | POA: Diagnosis not present

## 2017-11-11 DIAGNOSIS — M5431 Sciatica, right side: Secondary | ICD-10-CM | POA: Diagnosis not present

## 2017-11-11 DIAGNOSIS — M9905 Segmental and somatic dysfunction of pelvic region: Secondary | ICD-10-CM | POA: Diagnosis not present

## 2017-11-11 DIAGNOSIS — M9903 Segmental and somatic dysfunction of lumbar region: Secondary | ICD-10-CM | POA: Diagnosis not present

## 2017-11-11 DIAGNOSIS — M5416 Radiculopathy, lumbar region: Secondary | ICD-10-CM | POA: Diagnosis not present

## 2017-11-12 DIAGNOSIS — M5431 Sciatica, right side: Secondary | ICD-10-CM | POA: Diagnosis not present

## 2017-11-12 DIAGNOSIS — M5416 Radiculopathy, lumbar region: Secondary | ICD-10-CM | POA: Diagnosis not present

## 2017-11-12 DIAGNOSIS — M9903 Segmental and somatic dysfunction of lumbar region: Secondary | ICD-10-CM | POA: Diagnosis not present

## 2017-11-12 DIAGNOSIS — M9905 Segmental and somatic dysfunction of pelvic region: Secondary | ICD-10-CM | POA: Diagnosis not present

## 2017-11-13 DIAGNOSIS — Z711 Person with feared health complaint in whom no diagnosis is made: Secondary | ICD-10-CM | POA: Diagnosis not present

## 2017-11-13 DIAGNOSIS — D2239 Melanocytic nevi of other parts of face: Secondary | ICD-10-CM | POA: Diagnosis not present

## 2017-11-13 DIAGNOSIS — L811 Chloasma: Secondary | ICD-10-CM | POA: Diagnosis not present

## 2017-11-17 DIAGNOSIS — B350 Tinea barbae and tinea capitis: Secondary | ICD-10-CM | POA: Diagnosis not present

## 2017-11-17 DIAGNOSIS — L089 Local infection of the skin and subcutaneous tissue, unspecified: Secondary | ICD-10-CM | POA: Diagnosis not present

## 2017-11-20 DIAGNOSIS — M9905 Segmental and somatic dysfunction of pelvic region: Secondary | ICD-10-CM | POA: Diagnosis not present

## 2017-11-20 DIAGNOSIS — M5416 Radiculopathy, lumbar region: Secondary | ICD-10-CM | POA: Diagnosis not present

## 2017-11-20 DIAGNOSIS — M5431 Sciatica, right side: Secondary | ICD-10-CM | POA: Diagnosis not present

## 2017-11-20 DIAGNOSIS — M9903 Segmental and somatic dysfunction of lumbar region: Secondary | ICD-10-CM | POA: Diagnosis not present

## 2017-12-01 DIAGNOSIS — M9905 Segmental and somatic dysfunction of pelvic region: Secondary | ICD-10-CM | POA: Diagnosis not present

## 2017-12-01 DIAGNOSIS — M5416 Radiculopathy, lumbar region: Secondary | ICD-10-CM | POA: Diagnosis not present

## 2017-12-01 DIAGNOSIS — M9903 Segmental and somatic dysfunction of lumbar region: Secondary | ICD-10-CM | POA: Diagnosis not present

## 2017-12-01 DIAGNOSIS — M5431 Sciatica, right side: Secondary | ICD-10-CM | POA: Diagnosis not present

## 2017-12-09 DIAGNOSIS — M5416 Radiculopathy, lumbar region: Secondary | ICD-10-CM | POA: Diagnosis not present

## 2017-12-09 DIAGNOSIS — M5431 Sciatica, right side: Secondary | ICD-10-CM | POA: Diagnosis not present

## 2017-12-09 DIAGNOSIS — M9905 Segmental and somatic dysfunction of pelvic region: Secondary | ICD-10-CM | POA: Diagnosis not present

## 2017-12-09 DIAGNOSIS — M9903 Segmental and somatic dysfunction of lumbar region: Secondary | ICD-10-CM | POA: Diagnosis not present

## 2017-12-16 DIAGNOSIS — M5431 Sciatica, right side: Secondary | ICD-10-CM | POA: Diagnosis not present

## 2017-12-16 DIAGNOSIS — M9905 Segmental and somatic dysfunction of pelvic region: Secondary | ICD-10-CM | POA: Diagnosis not present

## 2017-12-16 DIAGNOSIS — M9903 Segmental and somatic dysfunction of lumbar region: Secondary | ICD-10-CM | POA: Diagnosis not present

## 2017-12-16 DIAGNOSIS — M5416 Radiculopathy, lumbar region: Secondary | ICD-10-CM | POA: Diagnosis not present

## 2017-12-24 DIAGNOSIS — M5431 Sciatica, right side: Secondary | ICD-10-CM | POA: Diagnosis not present

## 2017-12-24 DIAGNOSIS — M5416 Radiculopathy, lumbar region: Secondary | ICD-10-CM | POA: Diagnosis not present

## 2017-12-24 DIAGNOSIS — M9905 Segmental and somatic dysfunction of pelvic region: Secondary | ICD-10-CM | POA: Diagnosis not present

## 2017-12-24 DIAGNOSIS — M9903 Segmental and somatic dysfunction of lumbar region: Secondary | ICD-10-CM | POA: Diagnosis not present

## 2018-11-02 DIAGNOSIS — M5416 Radiculopathy, lumbar region: Secondary | ICD-10-CM | POA: Diagnosis not present

## 2018-11-02 DIAGNOSIS — M5431 Sciatica, right side: Secondary | ICD-10-CM | POA: Diagnosis not present

## 2018-11-02 DIAGNOSIS — M9905 Segmental and somatic dysfunction of pelvic region: Secondary | ICD-10-CM | POA: Diagnosis not present

## 2018-11-02 DIAGNOSIS — M9903 Segmental and somatic dysfunction of lumbar region: Secondary | ICD-10-CM | POA: Diagnosis not present

## 2018-11-19 DIAGNOSIS — H11021 Central pterygium of right eye: Secondary | ICD-10-CM | POA: Diagnosis not present

## 2019-02-17 DIAGNOSIS — F4320 Adjustment disorder, unspecified: Secondary | ICD-10-CM | POA: Diagnosis not present

## 2019-02-24 DIAGNOSIS — F4320 Adjustment disorder, unspecified: Secondary | ICD-10-CM | POA: Diagnosis not present

## 2019-03-08 DIAGNOSIS — F4320 Adjustment disorder, unspecified: Secondary | ICD-10-CM | POA: Diagnosis not present

## 2019-03-17 DIAGNOSIS — F4320 Adjustment disorder, unspecified: Secondary | ICD-10-CM | POA: Diagnosis not present

## 2019-03-23 ENCOUNTER — Other Ambulatory Visit: Payer: Self-pay

## 2019-03-23 ENCOUNTER — Encounter: Payer: Self-pay | Admitting: Family Medicine

## 2019-03-23 ENCOUNTER — Ambulatory Visit (INDEPENDENT_AMBULATORY_CARE_PROVIDER_SITE_OTHER): Payer: BC Managed Care – PPO | Admitting: Family Medicine

## 2019-03-23 VITALS — BP 121/76 | HR 87 | Temp 98.7°F | Ht 65.0 in | Wt 159.0 lb

## 2019-03-23 DIAGNOSIS — F321 Major depressive disorder, single episode, moderate: Secondary | ICD-10-CM

## 2019-03-23 DIAGNOSIS — Z23 Encounter for immunization: Secondary | ICD-10-CM | POA: Diagnosis not present

## 2019-03-23 NOTE — Progress Notes (Signed)
BP 121/76   Pulse 87   Temp 98.7 F (37.1 C) (Oral)   Ht 5\' 5"  (1.651 m)   Wt 159 lb (72.1 kg)   SpO2 100%   BMI 26.46 kg/m    Subjective:    Patient ID: Danielle Pearson, female    DOB: 1985-07-26, 33 y.o.   MRN: [id number]  HPI: Danielle Pearson is a 33 y.o. female  Chief Complaint  Patient presents with  . Depression   DEPRESSION- thinks it's been going on for about 2 years, but seems to be worse recently. Has been having trouble wanting to go to social events and feeling more withdrawn. Anxious about starting medicine. Started counseling recently with her husband Mood status: uncontrolled Satisfied with current treatment?: not on anything Symptom severity: moderate  Duration of current treatment : not on anything Psychotherapy/counseling: yes current Previous psychiatric medications: none Depressed mood: yes Anxious mood: yes Anhedonia: no Significant weight loss or gain: no Insomnia: yes hard to fall asleep Fatigue: yes Feelings of worthlessness or guilt: yes Impaired concentration/indecisiveness: yes Suicidal ideations: no Hopelessness: yes Crying spells: yes Depression screen [facility] 2/9 03/23/2019 01/29/2016 03/17/2015  Decreased Interest 2 0 0  Down, Depressed, Hopeless 3 0 0  PHQ - 2 Score 5 0 0  Altered sleeping 2 0 -  Tired, decreased energy 3 0 -  Change in appetite 2 0 -  Feeling bad or failure about yourself  2 0 -  Trouble concentrating 3 0 -  Moving slowly or fidgety/restless 0 0 -  Suicidal thoughts 0 0 -  PHQ-9 Score 17 0 -  Difficult doing work/chores Very difficult - -   Relevant past medical, surgical, family and social history reviewed and updated as indicated. Interim medical history since our last visit reviewed. Allergies and medications reviewed and updated.  Review of Systems  Constitutional: Negative.   Respiratory: Negative.   Cardiovascular: Negative.   Skin: Negative.   Neurological: Negative.   Psychiatric/Behavioral:  Positive for dysphoric mood and sleep disturbance. Negative for agitation, behavioral problems, confusion, decreased concentration, hallucinations, self-injury and suicidal ideas. The patient is nervous/anxious. The patient is not hyperactive.     Per HPI unless specifically indicated above     Objective:    BP 121/76   Pulse 87   Temp 98.7 F (37.1 C) (Oral)   Ht 5\' 5"  (1.651 m)   Wt 159 lb (72.1 kg)   SpO2 100%   BMI 26.46 kg/m   Wt Readings from Last 3 Encounters:  03/23/19 159 lb (72.1 kg)  08/21/17 165 lb 4.8 oz (75 kg)  08/14/17 165 lb 2 oz (74.9 kg)    Physical Exam Vitals signs and nursing note reviewed.  Constitutional:      General: She is not in acute distress.    Appearance: Normal appearance. She is not ill-appearing, toxic-appearing or diaphoretic.  HENT:     Head: Normocephalic and atraumatic.     Right Ear: External ear normal.     Left Ear: External ear normal.     Nose: Nose normal.     Mouth/Throat:     Mouth: Mucous membranes are moist.     Pharynx: Oropharynx is clear.  Eyes:     General: No scleral icterus.       Right eye: No discharge.        Left eye: No discharge.     Extraocular Movements: Extraocular movements intact.     Conjunctiva/sclera: Conjunctivae normal.  Pupils: Pupils are equal, round, and reactive to light.  Neck:     Musculoskeletal: Normal range of motion and neck supple.  Cardiovascular:     Rate and Rhythm: Normal rate and regular rhythm.     Pulses: Normal pulses.     Heart sounds: Normal heart sounds. No murmur. No friction rub. No gallop.   Pulmonary:     Effort: Pulmonary effort is normal. No respiratory distress.     Breath sounds: Normal breath sounds. No stridor. No wheezing, rhonchi or rales.  Chest:     Chest wall: No tenderness.  Musculoskeletal: Normal range of motion.  Skin:    General: Skin is warm and dry.     Capillary Refill: Capillary refill takes less than 2 seconds.     Coloration: Skin is not  jaundiced or pale.     Findings: No bruising, erythema, lesion or rash.  Neurological:     General: No focal deficit present.     Mental Status: She is alert and oriented to person, place, and time. Mental status is at baseline.  Psychiatric:        Mood and Affect: Mood normal.        Behavior: Behavior normal.        Thought Content: Thought content normal.        Judgment: Judgment normal.     Results for orders placed or performed in visit on 04/07/17  Pap IG (Image Guided)  Result Value Ref Range   DIAGNOSIS: Comment    Specimen adequacy: Comment    Clinician Provided ICD10 Comment    Performed by: Comment    QC reviewed by: Comment    PAP Smear Comment .    Note: Comment    Test Methodology Comment       Assessment & Plan:   Problem List Items Addressed This Visit      Other   Depression, major, single episode, moderate (HCC) - Primary    Not doing well. Will look over medication choices and consider. Will contact us with what she'd like to start. Checking labs today. Continue counseling. Call with any concerns.       Relevant Orders   CBC with Differential/Platelet   Comprehensive metabolic panel   TSH   VITAMIN D 25 Hydroxy (Vit-D Deficiency, Fractures)    Other Visit Diagnoses    Flu vaccine need       Flu shot given today.   Relevant Orders   Flu Vaccine QUAD 36+ mos IM (Completed)       Follow up plan: Return in about 4 weeks (around 04/20/2019).

## 2019-03-23 NOTE — Patient Instructions (Signed)
Living With Depression Everyone experiences occasional disappointment, sadness, and loss in their lives. When you are feeling down, blue, or sad for at least 2 weeks in a row, it may mean that you have depression. Depression can affect your thoughts and feelings, relationships, daily activities, and physical health. It is caused by changes in the way your brain functions. If you receive a diagnosis of depression, your health care provider will tell you which type of depression you have and what treatment options are available to you. If you are living with depression, there are ways to help you recover from it and also ways to prevent it from coming back. How to cope with lifestyle changes Coping with stress     Stress is your body's reaction to life changes and events, both good and bad. Stressful situations may include:  Getting married.  The death of a spouse.  Losing a job.  Retiring.  Having a baby. Stress can last just a few hours or it can be ongoing. Stress can play a major role in depression, so it is important to learn both how to cope with stress and how to think about it differently. Talk with your health care provider or a counselor if you would like to learn more about stress reduction. He or she may suggest some stress reduction techniques, such as:  Music therapy. This can include creating music or listening to music. Choose music that you enjoy and that inspires you.  Mindfulness-based meditation. This kind of meditation can be done while sitting or walking. It involves being aware of your normal breaths, rather than trying to control your breathing.  Centering prayer. This is a kind of meditation that involves focusing on a spiritual word or phrase. Choose a word, phrase, or sacred image that is meaningful to you and that brings you peace.  Deep breathing. To do this, expand your stomach and inhale slowly through your nose. Hold your breath for 3-5 seconds, then exhale  slowly, allowing your stomach muscles to relax.  Muscle relaxation. This involves intentionally tensing muscles then relaxing them. Choose a stress reduction technique that fits your lifestyle and personality. Stress reduction techniques take time and practice to develop. Set aside 5-15 minutes a day to do them. Therapists can offer training in these techniques. The training may be covered by some insurance plans. Other things you can do to manage stress include:  Keeping a stress diary. This can help you learn what triggers your stress and ways to control your response.  Understanding what your limits are and saying no to requests or events that lead to a schedule that is too full.  Thinking about how you respond to certain situations. You may not be able to control everything, but you can control how you react.  Adding humor to your life by watching funny films or TV shows.  Making time for activities that help you relax and not feeling guilty about spending your time this way.  Medicines Your health care provider may suggest certain medicines if he or she feels that they will help improve your condition. Avoid using alcohol and other substances that may prevent your medicines from working properly (may interact). It is also important to:  Talk with your pharmacist or health care provider about all the medicines that you take, their possible side effects, and what medicines are safe to take together.  Make it your goal to take part in all treatment decisions (shared decision-making). This includes giving input on   the side effects of medicines. It is best if shared decision-making with your health care provider is part of your total treatment plan. If your health care provider prescribes a medicine, you may not notice the full benefits of it for 4-8 weeks. Most people who are treated for depression need to be on medicine for at least 6-12 months after they feel better. If you are taking  medicines as part of your treatment, do not stop taking medicines without first talking to your health care provider. You may need to have the medicine slowly decreased (tapered) over time to decrease the risk of harmful side effects. Relationships Your health care provider may suggest family therapy along with individual therapy and drug therapy. While there may not be family problems that are causing you to feel depressed, it is still important to make sure your family learns as much as they can about your mental health. Having your family's support can help make your treatment successful. How to recognize changes in your condition Everyone has a different response to treatment for depression. Recovery from major depression happens when you have not had signs of major depression for two months. This may mean that you will start to:  Have more interest in doing activities.  Feel less hopeless than you did 2 months ago.  Have more energy.  Overeat less often, or have better or improving appetite.  Have better concentration. Your health care provider will work with you to decide the next steps in your recovery. It is also important to recognize when your condition is getting worse. Watch for these signs:  Having fatigue or low energy.  Eating too much or too little.  Sleeping too much or too little.  Feeling restless, agitated, or hopeless.  Having trouble concentrating or making decisions.  Having unexplained physical complaints.  Feeling irritable, angry, or aggressive. Get help as soon as you or your family members notice these symptoms coming back. How to get support and help from others How to talk with friends and family members about your condition  Talking to friends and family members about your condition can provide you with one way to get support and guidance. Reach out to trusted friends or family members, explain your symptoms to them, and let them know that you are  working with a health care provider to treat your depression. Financial resources Not all insurance plans cover mental health care, so it is important to check with your insurance carrier. If paying for co-pays or counseling services is a problem, search for a local or county mental health care center. They may be able to offer public mental health care services at low or no cost when you are not able to see a private health care provider. If you are taking medicine for depression, you may be able to get the generic form, which may be less expensive. Some makers of prescription medicines also offer help to patients who cannot afford the medicines they need. Follow these instructions at home:   Get the right amount and quality of sleep.  Cut down on using caffeine, tobacco, alcohol, and other potentially harmful substances.  Try to exercise, such as walking or lifting small weights.  Take over-the-counter and prescription medicines only as told by your health care provider.  Eat a healthy diet that includes plenty of vegetables, fruits, whole grains, low-fat dairy products, and lean protein. Do not eat a lot of foods that are high in solid fats, added sugars, or salt.    Keep all follow-up visits as told by your health care provider. This is important. Contact a health care provider if:  You stop taking your antidepressant medicines, and you have any of these symptoms: ? Nausea. ? Headache. ? Feeling lightheaded. ? Chills and body aches. ? Not being able to sleep (insomnia).  You or your friends and family think your depression is getting worse. Get help right away if:  You have thoughts of hurting yourself or others. If you ever feel like you may hurt yourself or others, or have thoughts about taking your own life, get help right away. You can go to your nearest emergency department or call:  Your local emergency services (911 in the [place]  A suicide crisis helpline, such as the  National Suicide Prevention Lifeline at 952-047-04871-770-093-1910. This is open 24-hours a day. Summary  If you are living with depression, there are ways to help you recover from it and also ways to prevent it from coming back.  Work with your health care team to create a management plan that includes counseling, stress management techniques, and healthy lifestyle habits. This information is not intended to replace advice given to you by your health care provider. Make sure you discuss any questions you have with your health care provider. Document Released: 05/06/2016 Document Revised: 09/25/2018 Document Reviewed: 05/06/2016 Elsevier Patient Education  2020 [employer] Escitalopram tablets What is this medicine? ESCITALOPRAM (es sye TAL oh pram) is used to treat depression and certain types of anxiety. This medicine may be used for other purposes; ask your health care provider or pharmacist if you have questions. COMMON BRAND NAME(S): Lexapro What should I tell my health care provider before I take this medicine? They need to know if you have any of these conditions:  bipolar disorder or a family history of bipolar disorder  diabetes  glaucoma  heart disease  kidney or liver disease  receiving electroconvulsive therapy  seizures (convulsions)  suicidal thoughts, plans, or attempt by you or a family member  an unusual or allergic reaction to escitalopram, the related drug citalopram, other medicines, foods, dyes, or preservatives  pregnant or trying to become pregnant  breast-feeding How should I use this medicine? Take this medicine by mouth with a glass of water. Follow the directions on the prescription label. You can take it with or without food. If it upsets your stomach, take it with food. Take your medicine at regular intervals. Do not take it more often than directed. Do not stop taking this medicine suddenly except upon the advice of your doctor. Stopping this medicine too  quickly may cause serious side effects or your condition may worsen. A special MedGuide will be given to you by the pharmacist with each prescription and refill. Be sure to read this information carefully each time. Talk to your pediatrician regarding the use of this medicine in children. Special care may be needed. Overdosage: If you think you have taken too much of this medicine contact a poison control center or emergency room at once. NOTE: This medicine is only for you. Do not share this medicine with others. What if I miss a dose? If you miss a dose, take it as soon as you can. If it is almost time for your next dose, take only that dose. Do not take double or extra doses. What may interact with this medicine? Do not take this medicine with any of the following medications:  certain medicines for fungal infections like fluconazole, itraconazole, ketoconazole,  posaconazole, voriconazole  cisapride  citalopram  dronedarone  linezolid  MAOIs like Carbex, Eldepryl, Marplan, Nardil, and Parnate  methylene blue (injected into a vein)  pimozide  thioridazine This medicine may also interact with the following medications:  alcohol  amphetamines  aspirin and aspirin-like medicines  carbamazepine  certain medicines for depression, anxiety, or psychotic disturbances  certain medicines for migraine headache like almotriptan, eletriptan, frovatriptan, naratriptan, rizatriptan, sumatriptan, zolmitriptan  certain medicines for sleep  certain medicines that treat or prevent blood clots like warfarin, enoxaparin, dalteparin  cimetidine  diuretics  dofetilide  fentanyl  furazolidone  isoniazid  lithium  metoprolol  NSAIDs, medicines for pain and inflammation, like ibuprofen or naproxen  other medicines that prolong the QT interval (cause an abnormal heart rhythm)  procarbazine  rasagiline  supplements like St. John's wort, kava kava, valerian  tramadol   tryptophan  ziprasidone This list may not describe all possible interactions. Give your health care provider a list of all the medicines, herbs, non-prescription drugs, or dietary supplements you use. Also tell them if you smoke, drink alcohol, or use illegal drugs. Some items may interact with your medicine. What should I watch for while using this medicine? Tell your doctor if your symptoms do not get better or if they get worse. Visit your doctor or health care professional for regular checks on your progress. Because it may take several weeks to see the full effects of this medicine, it is important to continue your treatment as prescribed by your doctor. Patients and their families should watch out for new or worsening thoughts of suicide or depression. Also watch out for sudden changes in feelings such as feeling anxious, agitated, panicky, irritable, hostile, aggressive, impulsive, severely restless, overly excited and hyperactive, or not being able to sleep. If this happens, especially at the beginning of treatment or after a change in dose, call your health care professional. Bonita Quin may get drowsy or dizzy. Do not drive, use machinery, or do anything that needs mental alertness until you know how this medicine affects you. Do not stand or sit up quickly, especially if you are an older patient. This reduces the risk of dizzy or fainting spells. Alcohol may interfere with the effect of this medicine. Avoid alcoholic drinks. Your mouth may get dry. Chewing sugarless gum or sucking hard candy, and drinking plenty of water may help. Contact your doctor if the problem does not go away or is severe. What side effects may I notice from receiving this medicine? Side effects that you should report to your doctor or health care professional as soon as possible:  allergic reactions like skin rash, itching or hives, swelling of the face, lips, or tongue  anxious  black, tarry stools  changes in vision   confusion  elevated mood, decreased need for sleep, racing thoughts, impulsive behavior  eye pain  fast, irregular heartbeat  feeling faint or lightheaded, falls  feeling agitated, angry, or irritable  hallucination, loss of contact with reality  loss of balance or coordination  loss of memory  painful or prolonged erections  restlessness, pacing, inability to keep still  seizures  stiff muscles  suicidal thoughts or other mood changes  trouble sleeping  unusual bleeding or bruising  unusually weak or tired  vomiting Side effects that usually do not require medical attention (report to your doctor or health care professional if they continue or are bothersome):  changes in appetite  change in sex drive or performance  headache  increased sweating  indigestion, nausea  tremors This list may not describe all possible side effects. Call your doctor for medical advice about side effects. You may report side effects to FDA at 1-800-FDA-1088. Where should I keep my medicine? Keep out of reach of children. Store at room temperature between 15 and 30 degrees C (59 and 86 degrees F). Throw away any unused medicine after the expiration date. NOTE: This sheet is a summary. It may not cover all possible information. If you have questions about this medicine, talk to your doctor, pharmacist, or health care provider.  2020 Elsevier/Gold Standard (2018-05-25 11:21:44) Sertraline tablets What is this medicine? SERTRALINE (SER tra leen) is used to treat depression. It may also be used to treat obsessive compulsive disorder, panic disorder, post-trauma stress, premenstrual dysphoric disorder (PMDD) or social anxiety. This medicine may be used for other purposes; ask your health care provider or pharmacist if you have questions. COMMON BRAND NAME(S): Zoloft What should I tell my health care provider before I take this medicine? They need to know if you have any of these  conditions:  bleeding disorders  bipolar disorder or a family history of bipolar disorder  glaucoma  heart disease  high blood pressure  history of irregular heartbeat  history of low levels of calcium, magnesium, or potassium in the blood  if you often drink alcohol  liver disease  receiving electroconvulsive therapy  seizures  suicidal thoughts, plans, or attempt; a previous suicide attempt by you or a family member  take medicines that treat or prevent blood clots  thyroid disease  an unusual or allergic reaction to sertraline, other medicines, foods, dyes, or preservatives  pregnant or trying to get pregnant  breast-feeding How should I use this medicine? Take this medicine by mouth with a glass of water. Follow the directions on the prescription label. You can take it with or without food. Take your medicine at regular intervals. Do not take your medicine more often than directed. Do not stop taking this medicine suddenly except upon the advice of your doctor. Stopping this medicine too quickly may cause serious side effects or your condition may worsen. A special MedGuide will be given to you by the pharmacist with each prescription and refill. Be sure to read this information carefully each time. Talk to your pediatrician regarding the use of this medicine in children. While this drug may be prescribed for children as young as 7 years for selected conditions, precautions do apply. Overdosage: If you think you have taken too much of this medicine contact a poison control center or emergency room at once. NOTE: This medicine is only for you. Do not share this medicine with others. What if I miss a dose? If you miss a dose, take it as soon as you can. If it is almost time for your next dose, take only that dose. Do not take double or extra doses. What may interact with this medicine? Do not take this medicine with any of the following medications:  cisapride   dronedarone  linezolid  MAOIs like Carbex, Eldepryl, Marplan, Nardil, and Parnate  methylene blue (injected into a vein)  pimozide  thioridazine This medicine may also interact with the following medications:  alcohol  amphetamines  aspirin and aspirin-like medicines  certain medicines for depression, anxiety, or psychotic disturbances  certain medicines for fungal infections like ketoconazole, fluconazole, posaconazole, and itraconazole  certain medicines for irregular heart beat like flecainide, quinidine, propafenone  certain medicines for migraine headaches like almotriptan, eletriptan,  frovatriptan, naratriptan, rizatriptan, sumatriptan, zolmitriptan  certain medicines for sleep  certain medicines for seizures like carbamazepine, valproic acid, phenytoin  certain medicines that treat or prevent blood clots like warfarin, enoxaparin, dalteparin  cimetidine  digoxin  diuretics  fentanyl  isoniazid  lithium  NSAIDs, medicines for pain and inflammation, like ibuprofen or naproxen  other medicines that prolong the QT interval (cause an abnormal heart rhythm) like dofetilide  rasagiline  safinamide  supplements like St. John's wort, kava kava, valerian  tolbutamide  tramadol  tryptophan This list may not describe all possible interactions. Give your health care provider a list of all the medicines, herbs, non-prescription drugs, or dietary supplements you use. Also tell them if you smoke, drink alcohol, or use illegal drugs. Some items may interact with your medicine. What should I watch for while using this medicine? Tell your doctor if your symptoms do not get better or if they get worse. Visit your doctor or health care professional for regular checks on your progress. Because it may take several weeks to see the full effects of this medicine, it is important to continue your treatment as prescribed by your doctor. Patients and their families should  watch out for new or worsening thoughts of suicide or depression. Also watch out for sudden changes in feelings such as feeling anxious, agitated, panicky, irritable, hostile, aggressive, impulsive, severely restless, overly excited and hyperactive, or not being able to sleep. If this happens, especially at the beginning of treatment or after a change in dose, call your health care professional. Bonita Quin may get drowsy or dizzy. Do not drive, use machinery, or do anything that needs mental alertness until you know how this medicine affects you. Do not stand or sit up quickly, especially if you are an older patient. This reduces the risk of dizzy or fainting spells. Alcohol may interfere with the effect of this medicine. Avoid alcoholic drinks. Your mouth may get dry. Chewing sugarless gum or sucking hard candy, and drinking plenty of water may help. Contact your doctor if the problem does not go away or is severe. What side effects may I notice from receiving this medicine? Side effects that you should report to your doctor or health care professional as soon as possible:  allergic reactions like skin rash, itching or hives, swelling of the face, lips, or tongue  anxious  black, tarry stools  changes in vision  confusion  elevated mood, decreased need for sleep, racing thoughts, impulsive behavior  eye pain  fast, irregular heartbeat  feeling faint or lightheaded, falls  feeling agitated, angry, or irritable  hallucination, loss of contact with reality  loss of balance or coordination  loss of memory  painful or prolonged erections  restlessness, pacing, inability to keep still  seizures  stiff muscles  suicidal thoughts or other mood changes  trouble sleeping  unusual bleeding or bruising  unusually weak or tired  vomiting Side effects that usually do not require medical attention (report to your doctor or health care professional if they continue or are bothersome):   change in appetite or weight  change in sex drive or performance  diarrhea  increased sweating  indigestion, nausea  tremors This list may not describe all possible side effects. Call your doctor for medical advice about side effects. You may report side effects to FDA at 1-800-FDA-1088. Where should I keep my medicine? Keep out of the reach of children. Store at room temperature between 15 and 30 degrees C (59 and 86  degrees F). Throw away any unused medicine after the expiration date. NOTE: This sheet is a summary. It may not cover all possible information. If you have questions about this medicine, talk to your doctor, pharmacist, or health care provider.  2020 Elsevier/Gold Standard (2018-05-26 10:09:27)

## 2019-03-23 NOTE — Assessment & Plan Note (Signed)
Not doing well. Will look over medication choices and consider. Will contact us with what she'd like to start. Checking labs today. Continue counseling. Call with any concerns.

## 2019-03-24 ENCOUNTER — Encounter: Payer: Self-pay | Admitting: Family Medicine

## 2019-03-24 ENCOUNTER — Other Ambulatory Visit: Payer: Self-pay | Admitting: Family Medicine

## 2019-03-24 LAB — CBC WITH DIFFERENTIAL/PLATELET
Basophils Absolute: 0.1 10*3/uL (ref 0.0–0.2)
Basos: 1 %
EOS (ABSOLUTE): 0.4 10*3/uL (ref 0.0–0.4)
Eos: 4 %
Hematocrit: 41.5 % (ref 34.0–46.6)
Hemoglobin: 14 g/dL (ref 11.1–15.9)
Immature Grans (Abs): 0 10*3/uL (ref 0.0–0.1)
Immature Granulocytes: 0 %
Lymphocytes Absolute: 2.3 10*3/uL (ref 0.7–3.1)
Lymphs: 26 %
MCH: 33.7 pg — ABNORMAL HIGH (ref 26.6–33.0)
MCHC: 33.7 g/dL (ref 31.5–35.7)
MCV: 100 fL — ABNORMAL HIGH (ref 79–97)
Monocytes Absolute: 0.6 10*3/uL (ref 0.1–0.9)
Monocytes: 6 %
Neutrophils Absolute: 5.6 10*3/uL (ref 1.4–7.0)
Neutrophils: 63 %
Platelets: 373 10*3/uL (ref 150–450)
RBC: 4.16 x10E6/uL (ref 3.77–5.28)
RDW: 11.6 % — ABNORMAL LOW (ref 11.7–15.4)
WBC: 8.9 10*3/uL (ref 3.4–10.8)

## 2019-03-24 LAB — COMPREHENSIVE METABOLIC PANEL
ALT: 14 IU/L (ref 0–32)
AST: 16 IU/L (ref 0–40)
Albumin/Globulin Ratio: 2.1 (ref 1.2–2.2)
Albumin: 4.9 g/dL — ABNORMAL HIGH (ref 3.8–4.8)
Alkaline Phosphatase: 58 IU/L (ref 39–117)
BUN/Creatinine Ratio: 14 (ref 9–23)
BUN: 10 mg/dL (ref 6–20)
Bilirubin Total: 0.7 mg/dL (ref 0.0–1.2)
CO2: 21 mmol/L (ref 20–29)
Calcium: 9.9 mg/dL (ref 8.7–10.2)
Chloride: 107 mmol/L — ABNORMAL HIGH (ref 96–106)
Creatinine, Ser: 0.73 mg/dL (ref 0.57–1.00)
GFR calc Af Amer: 125 mL/min/{1.73_m2} (ref >59)
GFR calc non Af Amer: 109 mL/min/{1.73_m2} (ref >59)
Globulin, Total: 2.3 g/dL (ref 1.5–4.5)
Glucose: 90 mg/dL (ref 65–99)
Potassium: 5.4 mmol/L — ABNORMAL HIGH (ref 3.5–5.2)
Sodium: 142 mmol/L (ref 134–144)
Total Protein: 7.2 g/dL (ref 6.0–8.5)

## 2019-03-24 LAB — TSH: TSH: 1.68 u[IU]/mL (ref 0.450–4.500)

## 2019-03-24 LAB — VITAMIN D 25 HYDROXY (VIT D DEFICIENCY, FRACTURES): Vit D, 25-Hydroxy: 20.2 ng/mL — ABNORMAL LOW (ref 30.0–100.0)

## 2019-03-24 MED ORDER — VITAMIN D (ERGOCALCIFEROL) 1.25 MG (50000 UNIT) PO CAPS: 50000.0000 [IU] | ORAL_CAPSULE | ORAL | 0 refills | Status: DC

## 2019-04-01 DIAGNOSIS — F4320 Adjustment disorder, unspecified: Secondary | ICD-10-CM | POA: Diagnosis not present

## 2019-04-05 ENCOUNTER — Ambulatory Visit (INDEPENDENT_AMBULATORY_CARE_PROVIDER_SITE_OTHER): Payer: BC Managed Care – PPO | Admitting: Obstetrics and Gynecology

## 2019-04-05 ENCOUNTER — Encounter: Payer: Self-pay | Admitting: Obstetrics and Gynecology

## 2019-04-05 ENCOUNTER — Other Ambulatory Visit (HOSPITAL_COMMUNITY)
Admission: RE | Admit: 2019-04-05 | Discharge: 2019-04-05 | Disposition: A | Discharge status: Home / Self Care | Payer: BC Managed Care – PPO | Source: Ambulatory Visit | Attending: Obstetrics and Gynecology | Admitting: Obstetrics and Gynecology

## 2019-04-05 ENCOUNTER — Other Ambulatory Visit: Payer: Self-pay

## 2019-04-05 VITALS — BP 120/70 | Ht 65.0 in | Wt 159.0 lb

## 2019-04-05 DIAGNOSIS — N930 Postcoital and contact bleeding: Secondary | ICD-10-CM

## 2019-04-05 MED ORDER — DOXYCYCLINE HYCLATE 100 MG PO CAPS: 100.0000 mg | ORAL_CAPSULE | Freq: Two times a day (BID) | ORAL | 0 refills | Status: DC

## 2019-04-05 NOTE — Patient Instructions (Signed)
I value your feedback and entrusting us with your care. If you get a [facility] patient survey, I would appreciate you taking the time to let us know about your experience today. Thank you! 

## 2019-04-05 NOTE — Progress Notes (Signed)
[name], DO   Chief Complaint  Patient presents with  . Vaginal Bleeding    after intercourse (first time ever), little cramping during intercourse    HPI:      [name] is a 33 y.o. No obstetric history on file. who LMP was Patient's last menstrual period was 03/24/2019 (exact date)., presents today for bleeding with sex yesterday. Blood was red and brown, and with mild cramping. No bleeding since. Never had sx before. No new sex partners. Last pap normal 10/18. No vag sx. Menses are monthly, lasting 1-2 days, no BTB, no dysmen.   Patient Active Problem List   Diagnosis Date Noted  . Depression, major, single episode, moderate (HCC) 03/23/2019  . Tobacco abuse 03/17/2015    Past Surgical History:  Procedure Laterality Date  . WISDOM TOOTH EXTRACTION      Family History  Problem Relation Age of Onset  . Hypertension Father     Social History   Socioeconomic History  . Marital status: Married    Spouse name: Not on file  . Number of children: Not on file  . Years of education: some colle  . Highest education level: Not on file  Occupational History  . Occupation: Haematologist  Social Needs  . Financial resource strain: Not on file  . Food insecurity    Worry: Not on file    Inability: Not on file  . Transportation needs    Medical: Not on file    Non-medical: Not on file  Tobacco Use  . Smoking status: Current Every Day Smoker    Packs/day: 0.50    Types: Cigarettes  . Smokeless tobacco: Never Used  Substance and Sexual Activity  . Alcohol use: Yes    Alcohol/week: 0.0 standard drinks    Comment: Socially  . Drug use: No  . Sexual activity: Yes    Birth control/protection: None  Lifestyle  . Physical activity    Days per week: Not on file    Minutes per session: Not on file  . Stress: Not on file  Relationships  . Social Musician on phone: Not on file    Gets together: Not on file    Attends religious service:  Not on file    Active member of club or organization: Not on file    Attends meetings of clubs or organizations: Not on file    Relationship status: Not on file  . Intimate partner violence    Fear of current or ex partner: Not on file    Emotionally abused: Not on file    Physically abused: Not on file    Forced sexual activity: Not on file  Other Topics Concern  . Not on file  Social History Narrative  . Not on file    Outpatient Medications Prior to Visit  Medication Sig Dispense Refill  . Vitamin D, Ergocalciferol, (DRISDOL) 1.25 MG (50000 UT) CAPS capsule Take 1 capsule (50,000 Units total) by mouth every 7 (seven) days. 12 capsule 0   No facility-administered medications prior to visit.       ROS:  Review of Systems  Constitutional: Negative for fever.  Gastrointestinal: Negative for blood in stool, constipation, diarrhea, nausea and vomiting.  Genitourinary: Positive for vaginal bleeding. Negative for dyspareunia, dysuria, flank pain, frequency, hematuria, urgency, vaginal discharge and vaginal pain.  Musculoskeletal: Negative for back pain.  Skin: Negative for rash.  BREAST: No symptoms   OBJECTIVE:  Vitals:  BP 120/70   Ht 5\' 5"  (1.651 m)   Wt 159 lb (72.1 kg)   LMP 03/24/2019 (Exact Date)   BMI 26.46 kg/m   Physical Exam Vitals signs reviewed.  Constitutional:      Appearance: She is well-developed.  Neck:     Musculoskeletal: Normal range of motion.  Pulmonary:     Effort: Pulmonary effort is normal.  Genitourinary:    General: Normal vulva.     Pubic Area: No rash.      Labia:        Right: No rash, tenderness or lesion.        Left: No rash, tenderness or lesion.      Vagina: Normal. No vaginal discharge, erythema or tenderness.     Cervix: No friability or cervical bleeding.     Uterus: Normal. Not enlarged and not tender.      Adnexa: Right adnexa normal and left adnexa normal.       Right: No mass or tenderness.         Left: No mass or  tenderness.       Comments: SCANT BROWN D/C AT CX OS; CX NOT FRIABLE Musculoskeletal: Normal range of motion.  Skin:    General: Skin is warm and dry.  Neurological:     General: No focal deficit present.     Mental Status: She is alert and oriented to person, place, and time.  Psychiatric:        Mood and Affect: Mood normal.        Behavior: Behavior normal.        Thought Content: Thought content normal.        Judgment: Judgment normal.     Assessment/Plan: Postcoital bleeding - Plan: doxycycline (VIBRAMYCIN) 100 MG capsule, Cervicovaginal ancillary only; Sx one time. Neg exam for friable cx. Check STD testing. Will f/u if pos. Rx doxy. F/u prn.    Meds ordered this encounter  Medications  . doxycycline (VIBRAMYCIN) 100 MG capsule    Sig: Take 1 capsule (100 mg total) by mouth 2 (two) times daily.    Dispense:  14 capsule    Refill:  0    Order Specific Question:   Supervising Provider    Answer:   Gae Dry [[id number]]      Return if symptoms worsen or fail to improve.   B. , PA-C 04/05/2019 2:26 PM

## 2019-04-07 ENCOUNTER — Encounter: Payer: Self-pay | Admitting: Family Medicine

## 2019-04-08 LAB — CERVICOVAGINAL ANCILLARY ONLY
Chlamydia: NEGATIVE
Comment: NEGATIVE
Comment: NORMAL
Neisseria Gonorrhea: NEGATIVE

## 2019-04-08 MED ORDER — ESCITALOPRAM OXALATE 5 MG PO TABS: 5.0000 mg | ORAL_TABLET | Freq: Every day | ORAL | 2 refills | Status: DC

## 2019-04-08 NOTE — Telephone Encounter (Signed)
Can we get her in for a 1 month follow up on her mood?

## 2019-04-12 NOTE — Telephone Encounter (Signed)
LVM to call back.

## 2019-04-15 DIAGNOSIS — F4320 Adjustment disorder, unspecified: Secondary | ICD-10-CM | POA: Diagnosis not present

## 2019-05-19 ENCOUNTER — Other Ambulatory Visit: Payer: Self-pay

## 2019-05-19 ENCOUNTER — Ambulatory Visit (INDEPENDENT_AMBULATORY_CARE_PROVIDER_SITE_OTHER): Payer: BC Managed Care – PPO | Admitting: Family Medicine

## 2019-05-19 ENCOUNTER — Encounter: Payer: Self-pay | Admitting: Family Medicine

## 2019-05-19 DIAGNOSIS — F321 Major depressive disorder, single episode, moderate: Secondary | ICD-10-CM

## 2019-05-19 MED ORDER — TRAZODONE HCL 50 MG PO TABS: 25.0000 mg | ORAL_TABLET | Freq: Every evening | ORAL | 1 refills | Status: DC | PRN

## 2019-05-19 NOTE — Assessment & Plan Note (Signed)
Doing better, but not sure how well. Will give her another 2 weeks and will start PRN trazodone to help with insomnia. Call with any concerns. Continue to monitor.

## 2019-05-19 NOTE — Progress Notes (Signed)
There were no vitals taken for this visit.   Subjective:    Patient ID: Danielle Pearson, female    DOB: Oct 08, 1985, 33 y.o.   MRN: [id number]  HPI: Danielle Pearson is a 33 y.o. female  Chief Complaint  Patient presents with  . Depression   DEPRESSION Mood status: better Satisfied with current treatment?: no Symptom severity: moderate  Duration of current treatment : 1 month Side effects: hands and feet are sweaty Medication compliance: excellent compliance Psychotherapy/counseling: no  Previous psychiatric medications: lexapro Depressed mood: yes Anxious mood: yes Anhedonia: no Significant weight loss or gain: no Insomnia: yes hard to stay asleep Fatigue: yes Feelings of worthlessness or guilt: no Impaired concentration/indecisiveness: no Suicidal ideations: no Hopelessness: no Crying spells: no Depression screen [facility] 2/9 05/19/2019 03/23/2019 01/29/2016 03/17/2015  Decreased Interest 1 2 0 0  Down, Depressed, Hopeless 1 3 0 0  PHQ - 2 Score 2 5 0 0  Altered sleeping 2 2 0 -  Tired, decreased energy 1 3 0 -  Change in appetite 0 2 0 -  Feeling bad or failure about yourself  0 2 0 -  Trouble concentrating 0 3 0 -  Moving slowly or fidgety/restless 0 0 0 -  Suicidal thoughts 0 0 0 -  PHQ-9 Score 5 17 0 -  Difficult doing work/chores Not difficult at all Very difficult - -    Relevant past medical, surgical, family and social history reviewed and updated as indicated. Interim medical history since our last visit reviewed. Allergies and medications reviewed and updated.  Review of Systems  Constitutional: Negative.   Respiratory: Negative.   Cardiovascular: Negative.   Musculoskeletal: Negative.   Skin: Negative.   Psychiatric/Behavioral: Positive for dysphoric mood and sleep disturbance. Negative for agitation, behavioral problems, confusion, decreased concentration, hallucinations, self-injury and suicidal ideas. The patient is nervous/anxious. The patient is  not hyperactive.     Per HPI unless specifically indicated above     Objective:    There were no vitals taken for this visit.  Wt Readings from Last 3 Encounters:  04/05/19 159 lb (72.1 kg)  03/23/19 159 lb (72.1 kg)  08/21/17 165 lb 4.8 oz (75 kg)    Physical Exam Vitals signs and nursing note reviewed.  Pulmonary:     Effort: Pulmonary effort is normal. No respiratory distress.     Comments: Speaking in full sentences Neurological:     Mental Status: She is alert.  Psychiatric:        Mood and Affect: Mood normal.        Behavior: Behavior normal.        Thought Content: Thought content normal.        Judgment: Judgment normal.     Results for orders placed or performed in visit on 04/05/19  Cervicovaginal ancillary only  Result Value Ref Range   Neisseria Gonorrhea Negative    Chlamydia Negative    Comment Normal Reference Ranger Chlamydia - Negative    Comment      Normal Reference Range Neisseria Gonorrhea - Negative      Assessment & Plan:   Problem List Items Addressed This Visit      Other   Depression, major, single episode, moderate ([place])    Doing better, but not sure how well. Will give her another 2 weeks and will start PRN trazodone to help with insomnia. Call with any concerns. Continue to monitor.       Relevant Medications  traZODone (DESYREL) 50 MG tablet       Follow up plan: Return in about 6 months (around 11/17/2019).    . This visit was completed via telephone due to the restrictions of the COVID-19 pandemic. All issues as above were discussed and addressed but no physical exam was performed. If it was felt that the patient should be evaluated in the office, they were directed there. The patient verbally consented to this visit. Patient was unable to complete an audio/visual visit due to Lack of equipment. Due to the catastrophic nature of the COVID-19 pandemic, this visit was done through audio contact only. . Location of the patient:  home . Location of the provider: home . Those involved with this call:  . Provider: Olevia Perches, DO . CMA: Tiffany Reel, CMA . Front Desk/Registration: Adela Ports  . Time spent on call: 21 minutes on the phone discussing health concerns. 23 minutes total spent in review of patient's record and preparation of their chart.

## 2019-05-20 DIAGNOSIS — F4320 Adjustment disorder, unspecified: Secondary | ICD-10-CM | POA: Diagnosis not present

## 2019-06-02 ENCOUNTER — Encounter: Payer: Self-pay | Admitting: Family Medicine

## 2019-06-19 ENCOUNTER — Other Ambulatory Visit: Payer: Self-pay | Admitting: Family Medicine

## 2019-06-20 NOTE — Telephone Encounter (Signed)
Requested medication (s) are due for refill today: yes  Requested medication (s) are on the active medication list: yes  Last refill:  03/24/2019  Future visit scheduled: no  Notes to clinic:  50,000 IU strengths are not delegated   Requested Prescriptions  Pending Prescriptions Disp Refills   Vitamin D, Ergocalciferol, (DRISDOL) 1.25 MG (50000 UT) CAPS capsule [Pharmacy Med Name: VITAMIN D2 1.25MG (50,000 UNIT)] 12 capsule 0    Sig: Take 1 capsule (50,000 Units total) by mouth every 7 (seven) days.      Endocrinology:  Vitamins - Vitamin D Supplementation Failed - 06/19/2019  9:47 AM      Failed - 50,000 IU strengths are not delegated      Failed - Phosphate in normal range and within 360 days    No results found for: PHOS        Failed - Vitamin D in normal range and within 360 days    Vit D, 25-Hydroxy  Date Value Ref Range Status  03/23/2019 20.2 (L) 30.0 - 100.0 ng/mL Final    Comment:    Vitamin D deficiency has been defined by the Institute of Medicine and an Endocrine Society Pearson guideline as a level of serum 25-OH vitamin D less than 20 ng/mL (1,2). The Endocrine Society went on to further define vitamin D insufficiency as a level between 21 and 29 ng/mL (2). 1. IOM (Institute of Medicine). 2010. Dietary reference    intakes for calcium and D. [place]: The    [employer]. 2. Holick MF, Binkley [place], Bischoff-Ferrari HA, et al.    Evaluation, treatment, and prevention of vitamin D    deficiency: an Endocrine Society clinical Pearson    guideline. JCEM. 2011 Jul; 96(7):1911-30.           Passed - Ca in normal range and within 360 days    Calcium  Date Value Ref Range Status  03/23/2019 9.9 8.7 - 10.2 mg/dL Final          Passed - Valid encounter within last 12 months    Recent Outpatient Visits           1 month ago Depression, major, single episode, moderate (HCC)   Danielle Pearson [place], Danielle P, DO   2 months ago  Depression, major, single episode, moderate (HCC)   Danielle Pearson [place], Danielle P, DO   1 year ago Impacted cerumen of right ear   [facility] Particia Nearing, PA-C   1 year ago Immunization due   [employer], [place], DO   2 years ago Cough   [facility] Gabriel Cirri, NP

## 2019-06-21 NOTE — Telephone Encounter (Signed)
Routing to provider  

## 2019-07-10 ENCOUNTER — Other Ambulatory Visit: Payer: Self-pay | Admitting: Family Medicine

## 2019-07-25 ENCOUNTER — Other Ambulatory Visit: Payer: Self-pay | Admitting: Family Medicine

## 2019-07-25 NOTE — Telephone Encounter (Signed)
Requested Prescriptions  Pending Prescriptions Disp Refills  . traZODone (DESYREL) 50 MG tablet [Pharmacy Med Name: TRAZODONE 50 MG TABLET] 30 tablet 1    Sig: TAKE 0.5-1 TABLETS (25-50 MG TOTAL) BY MOUTH AT BEDTIME AS NEEDED FOR SLEEP.     Psychiatry: Antidepressants - Serotonin Modulator Passed - 07/25/2019  9:53 AM      Passed - Completed PHQ-2 or PHQ-9 in the last 360 days.      Passed - Valid encounter within last 6 months    Recent Outpatient Visits          2 months ago Depression, major, single episode, moderate (HCC)   [facility] [place], Megan P, DO   4 months ago Depression, major, single episode, moderate (HCC)   [facility] [place], Megan P, DO   1 year ago Impacted cerumen of right ear   [facility] Particia Nearing, PA-C   1 year ago Immunization due   [employer], [place], DO   2 years ago Cough   [facility] Gabriel Cirri, NP

## 2019-08-03 DIAGNOSIS — M5416 Radiculopathy, lumbar region: Secondary | ICD-10-CM | POA: Diagnosis not present

## 2019-08-03 DIAGNOSIS — M9903 Segmental and somatic dysfunction of lumbar region: Secondary | ICD-10-CM | POA: Diagnosis not present

## 2019-08-03 DIAGNOSIS — M5431 Sciatica, right side: Secondary | ICD-10-CM | POA: Diagnosis not present

## 2019-08-03 DIAGNOSIS — M9905 Segmental and somatic dysfunction of pelvic region: Secondary | ICD-10-CM | POA: Diagnosis not present

## 2019-10-10 ENCOUNTER — Other Ambulatory Visit: Payer: Self-pay | Admitting: Family Medicine

## 2019-10-11 ENCOUNTER — Ambulatory Visit
Admission: EM | Admit: 2019-10-11 | Discharge: 2019-10-11 | Disposition: A | Discharge status: Home / Self Care | Payer: BC Managed Care – PPO | Attending: Emergency Medicine | Admitting: Emergency Medicine

## 2019-10-11 ENCOUNTER — Other Ambulatory Visit: Payer: Self-pay

## 2019-10-11 DIAGNOSIS — J069 Acute upper respiratory infection, unspecified: Secondary | ICD-10-CM

## 2019-10-11 DIAGNOSIS — Z20822 Contact with and (suspected) exposure to covid-19: Secondary | ICD-10-CM

## 2019-10-11 LAB — POC SARS CORONAVIRUS 2 AG -  ED: SARS Coronavirus 2 Ag: NEGATIVE

## 2019-10-11 NOTE — ED Provider Notes (Signed)
[place]   [id number] 10/11/19 Arrival Time: 3500   CC: COVID symptoms  SUBJECTIVE: History from: patient.  Danielle Pearson is a 34 y.o. female who presents with HA, runny nose, congestion, sore throat, and mild dry cough x 4 days.  Denies sick exposure to COVID, flu or strep.  Denies recent travel.  Has tried OTC medications with relief.  Denies aggravating factors.  Denies previous COVID infection in the past.   Denies fever, chills, fatigue, SOB, wheezing, chest pain, nausea, vomiting, changes in bowel or bladder habits.    ROS: As per HPI.  All other pertinent ROS negative.     Past Medical History:  Diagnosis Date  . Tobacco abuse    Past Surgical History:  Procedure Laterality Date  . WISDOM TOOTH EXTRACTION     No Known Allergies No current facility-administered medications on file prior to encounter.   Current Outpatient Medications on File Prior to Encounter  Medication Sig Dispense Refill  . escitalopram (LEXAPRO) 5 MG tablet TAKE 1 TABLET BY MOUTH EVERY DAY 30 tablet 2  . Vitamin D, Ergocalciferol, (DRISDOL) 1.25 MG (50000 UT) CAPS capsule TAKE 1 CAPSULE (50,000 UNITS TOTAL) BY MOUTH EVERY 7 (SEVEN) DAYS. 12 capsule 0  . [DISCONTINUED] traZODone (DESYREL) 50 MG tablet TAKE 0.5-1 TABLETS (25-50 MG TOTAL) BY MOUTH AT BEDTIME AS NEEDED FOR SLEEP. 30 tablet 1   Social History   Socioeconomic History  . Marital status: Married    Spouse name: Not on file  . Number of children: Not on file  . Years of education: some colle  . Highest education level: Not on file  Occupational History  . Occupation: Bank Teller  Tobacco Use  . Smoking status: Current Every Day Smoker    Packs/day: 0.50    Types: Cigarettes  . Smokeless tobacco: Never Used  Substance and Sexual Activity  . Alcohol use: Yes    Alcohol/week: 0.0 standard drinks    Comment: Socially  . Drug use: No  . Sexual activity: Yes    Birth control/protection: None  Other Topics Concern    . Not on file  Social History Narrative  . Not on file   Social Determinants of Health   Financial Resource Strain:   . Difficulty of Paying Living Expenses:   Food Insecurity:   . Worried About Charity fundraiser in the Last Year:   . Arboriculturist in the Last Year:   Transportation Needs:   . Film/video editor (Medical):   Marland Kitchen Lack of Transportation (Non-Medical):   Physical Activity:   . Days of Exercise per Week:   . Minutes of Exercise per Session:   Stress:   . Feeling of Stress :   Social Connections:   . Frequency of Communication with Friends and Family:   . Frequency of Social Gatherings with Friends and Family:   . Attends Religious Services:   . Active Member of Clubs or Organizations:   . Attends Archivist Meetings:   Marland Kitchen Marital Status:   Intimate Partner Violence:   . Fear of Current or Ex-Partner:   . Emotionally Abused:   Marland Kitchen Physically Abused:   . Sexually Abused:    Family History  Problem Relation Age of Onset  . Hypertension Father     OBJECTIVE:  Vitals:   10/11/19 1559  BP: 127/80  Pulse: 95  Resp: 18  Temp: 98.8 F (37.1 C)  SpO2: 97%     General appearance:  alert; well-appearing, nontoxic; speaking in full sentences and tolerating own secretions HEENT: NCAT; Ears: EACs clear, TMs pearly gray; Eyes: PERRL.  EOM grossly intact. Nose: nares patent without rhinorrhea, Throat: oropharynx clear, tonsils non erythematous or enlarged, uvula midline  Neck: supple without LAD Lungs: unlabored respirations, symmetrical air entry; cough: absent; no respiratory distress; CTAB Heart: regular rate and rhythm.   Skin: warm and dry Psychological: alert and cooperative; normal mood and affect  LABS:  Results for orders placed or performed during the hospital encounter of 10/11/19 (from the past 24 hour(s))  POC SARS Coronavirus 2 Ag-ED - Nasal Swab (BD Veritor Kit)     Status: None   Collection Time: 10/11/19  4:05 PM  Result Value  Ref Range   SARS Coronavirus 2 Ag Negative Negative     ASSESSMENT & PLAN:  1. Viral URI with cough   2. Suspected COVID-19 virus infection     Rapid COVID negative.  Culture sent.  Patient should remain in quarantine until they have received culture results.  If negative you may resume normal activities (go back to work/school) while practicing hand hygiene, social distance, and mask wearing.  If positive, patient should remain in quarantine for 10 days from symptom onset AND greater than 72 hours after symptoms resolution (absence of fever without the use of fever-reducing medication and improvement in respiratory symptoms), whichever is longer Get plenty of rest and push fluids Use OTC zyrtec for nasal congestion, runny nose, and/or sore throat Use OTC flonase for nasal congestion and runny nose Use medications daily for symptom relief Use OTC medications like ibuprofen or tylenol as needed fever or pain Call or go to the ED if you have any new or worsening symptoms such as fever, worsening cough, shortness of breath, chest tightness, chest pain, turning blue, changes in mental status, etc...    Reviewed expectations re: course of current medical issues. Questions answered. Outlined signs and symptoms indicating need for more acute intervention. Patient verbalized understanding. After Visit Summary given.         Lestine Box, PA-C 10/11/19 1622

## 2019-10-11 NOTE — Discharge Instructions (Addendum)
Rapid COVID negative.  Culture sent.  Patient should remain in quarantine until they have received culture results.  If negative you may resume normal activities (go back to work/school) while practicing hand hygiene, social distance, and mask wearing.  If positive, patient should remain in quarantine for 10 days from symptom onset AND greater than 72 hours after symptoms resolution (absence of fever without the use of fever-reducing medication and improvement in respiratory symptoms), whichever is longer Get plenty of rest and push fluids Use OTC zyrtec for nasal congestion, runny nose, and/or sore throat Use OTC flonase for nasal congestion and runny nose Use medications daily for symptom relief Use OTC medications like ibuprofen or tylenol as needed fever or pain Call or go to the ED if you have any new or worsening symptoms such as fever, worsening cough, shortness of breath, chest tightness, chest pain, turning blue, changes in mental status, etc..Marland Kitchen

## 2019-10-11 NOTE — ED Triage Notes (Signed)
Pt developed cough , headache and cold symptoms on Thursday

## 2019-10-12 LAB — SARS-COV-2, NAA 2 DAY TAT

## 2019-10-12 LAB — NOVEL CORONAVIRUS, NAA: SARS-CoV-2, NAA: NOT DETECTED

## 2019-11-25 ENCOUNTER — Encounter: Payer: Self-pay | Admitting: Family Medicine

## 2019-11-25 ENCOUNTER — Telehealth (INDEPENDENT_AMBULATORY_CARE_PROVIDER_SITE_OTHER): Payer: BC Managed Care – PPO | Admitting: Family Medicine

## 2019-11-25 VITALS — Temp 99.2°F

## 2019-11-25 DIAGNOSIS — J069 Acute upper respiratory infection, unspecified: Secondary | ICD-10-CM | POA: Diagnosis not present

## 2019-11-25 MED ORDER — PREDNISONE 50 MG PO TABS: 50.0000 mg | ORAL_TABLET | Freq: Every day | ORAL | 0 refills | Status: DC

## 2019-11-25 MED ORDER — HYDROCOD POLST-CPM POLST ER 10-8 MG/5ML PO SUER: 5.0000 mL | Freq: Two times a day (BID) | ORAL | 0 refills | Status: DC | PRN

## 2019-11-25 NOTE — Progress Notes (Signed)
Temp 99.2 F (37.3 C)   LMP 11/11/2019 (Within Days)    Subjective:    Patient ID: Danielle Pearson, female    DOB: May 29, 1986, 34 y.o.   MRN: [id number]  HPI: Patt Steinhardt is a 34 y.o. female  Chief Complaint  Patient presents with  . Sore Throat    has taken sudafed, does not know doseage  . Cough  . Nasal Congestion  . Otalgia   UPPER RESPIRATORY TRACT INFECTION Duration: 4-5 days Worst symptom: coughing at night Fever: yes Cough: yes Shortness of breath: no Wheezing: no Chest pain: no Chest tightness: no Chest congestion: no Nasal congestion: yes Runny nose: yes Post nasal drip: yes Sneezing: no Sore throat: yes Swollen glands: no Sinus pressure: yes Headache: yes Face pain: yes Toothache: no Ear pain: yes bilateral Ear pressure: yes bilateral Eyes red/itching:yes Eye drainage/crusting: no  Vomiting: no Rash: no Fatigue: yes Sick contacts: yes Strep contacts: no  Context: stable Recurrent sinusitis: no Relief with OTC cold/cough medications: yes  Treatments attempted: sudafed   Relevant past medical, surgical, family and social history reviewed and updated as indicated. Interim medical history since our last visit reviewed. Allergies and medications reviewed and updated.  Review of Systems  Constitutional: Negative.   HENT: Positive for congestion, postnasal drip, rhinorrhea, sinus pressure, sinus pain and sore throat. Negative for dental problem, drooling, ear discharge, ear pain, facial swelling, hearing loss, mouth sores, nosebleeds, sneezing, tinnitus, trouble swallowing and voice change.   Respiratory: Positive for cough. Negative for apnea, choking, chest tightness, shortness of breath, wheezing and stridor.   Cardiovascular: Negative.   Gastrointestinal: Negative.   Musculoskeletal: Negative.   Psychiatric/Behavioral: Negative.     Per HPI unless specifically indicated above     Objective:    Temp 99.2 F (37.3 C)   LMP  11/11/2019 (Within Days)   Wt Readings from Last 3 Encounters:  04/05/19 159 lb (72.1 kg)  03/23/19 159 lb (72.1 kg)  08/21/17 165 lb 4.8 oz (75 kg)    Physical Exam Vitals and nursing note reviewed.  Constitutional:      General: She is not in acute distress.    Appearance: Normal appearance. She is not ill-appearing, toxic-appearing or diaphoretic.  HENT:     Head: Normocephalic and atraumatic.     Right Ear: External ear normal.     Left Ear: External ear normal.     Nose: Congestion and rhinorrhea present.     Mouth/Throat:     Mouth: Mucous membranes are moist.     Pharynx: Oropharynx is clear.  Eyes:     General: No scleral icterus.       Right eye: No discharge.        Left eye: No discharge.     Conjunctiva/sclera: Conjunctivae normal.     Pupils: Pupils are equal, round, and reactive to light.  Pulmonary:     Effort: Pulmonary effort is normal. No respiratory distress.     Comments: Speaking in full sentences Musculoskeletal:        General: Normal range of motion.     Cervical back: Normal range of motion.  Skin:    Coloration: Skin is not jaundiced or pale.     Findings: No bruising, erythema, lesion or rash.  Neurological:     Mental Status: She is alert and oriented to person, place, and time. Mental status is at baseline.  Psychiatric:        Mood and Affect: Mood normal.  Behavior: Behavior normal.        Thought Content: Thought content normal.        Judgment: Judgment normal.     Results for orders placed or performed during the hospital encounter of 10/11/19  Novel Coronavirus, NAA ([facility])   Specimen: Nasal Swab; Nasopharyngeal(NP) swabs in vial transport medium   NASOPHARYNGE  PATIENT  Result Value Ref Range   SARS-CoV-2, NAA Not Detected Not Detected  SARS-COV-2, NAA 2 DAY TAT   NASOPHARYNGE  PATIENT  Result Value Ref Range   SARS-CoV-2, NAA 2 DAY TAT Performed   POC SARS Coronavirus 2 Ag-ED - Nasal Swab (BD Veritor Kit)  Result  Value Ref Range   SARS Coronavirus 2 Ag Negative Negative      Assessment & Plan:   Problem List Items Addressed This Visit    None    Visit Diagnoses    Upper respiratory tract infection, unspecified type    -  Primary   Will treat with prednisone and tussionex. Call with any concerns. Continue to monitor. If not better by middle of next week, will treat with abx.        Follow up plan: Return if symptoms worsen or fail to improve.    . This visit was completed via MyChart due to the restrictions of the COVID-19 pandemic. All issues as above were discussed and addressed. Physical exam was done as above through visual confirmation on MyChart. If it was felt that the patient should be evaluated in the office, they were directed there. The patient verbally consented to this visit. . Location of the patient: home . Location of the provider: work . Those involved with this call:  . Provider: Park Liter, DO . CMA: Lauretta Grill, RMA . Front Desk/Registration: Don Perking  . Time spent on call: 15 minutes with patient face to face via video conference. More than 50% of this time was spent in counseling and coordination of care. 23 minutes total spent in review of patient's record and preparation of their chart.

## 2020-01-17 ENCOUNTER — Other Ambulatory Visit: Payer: Self-pay | Admitting: Family Medicine

## 2020-02-23 ENCOUNTER — Ambulatory Visit: Payer: Self-pay | Admitting: Family Medicine

## 2020-02-23 NOTE — Telephone Encounter (Signed)
Pt reports during night, tripped and hit 4th toe, left foot, on table. "I think it's broke." States swelling and redness present, no bruising, no open wounds, nail intact. Reports 6/10 pain. Did apply ice after injury. Able to walk "But limping." NT called practice to check possibility of Xray order, no appts available, recommended UC. Pt aware, unsure  Will follow disposition.   Reason for Disposition  [1] Toe injury AND [2] bad limp or can't wear shoes/sandals  Answer Assessment - Initial Assessment Questions 1. MECHANISM: "How did the injury happen?"      Tripped and hit on table 2. ONSET: "When did the injury happen?" (Minutes or hours ago)      During night 3. LOCATION: "What part of the toe is injured?" "Is the nail damaged?"      Left foot, 4th toe. No 4. APPEARANCE of TOE INJURY: "What does the injury look like?"      Swelling, red. Knuckle at base of foot. 5. SEVERITY: "Can you use the foot normally?" "Can you walk?"      Walk with limp 6. SIZE: For cuts, bruises, or swelling, ask: "How large is it?" (e.g., inches or centimeters;  entire toe)      none 7. PAIN: "Is there pain?" If Yes, ask: "How bad is the pain?"   (e.g., Scale 1-10; or mild, moderate, severe)     6/10 8. TETANUS: For any breaks in the skin, ask: "When was the last tetanus booster?"      9. DIABETES: "Do you have a history of diabetes or poor circulation in the feet?"     no 10. OTHER SYMPTOMS: "Do you have any other symptoms?"        no  Protocols used: TOE INJURY-A-AH

## 2020-03-09 DIAGNOSIS — Z20822 Contact with and (suspected) exposure to covid-19: Secondary | ICD-10-CM | POA: Diagnosis not present

## 2020-03-18 ENCOUNTER — Encounter: Payer: Self-pay | Admitting: Family Medicine

## 2020-03-20 NOTE — Telephone Encounter (Signed)
Needs appt

## 2020-04-10 DIAGNOSIS — M62830 Muscle spasm of back: Secondary | ICD-10-CM | POA: Diagnosis not present

## 2020-04-10 DIAGNOSIS — M9903 Segmental and somatic dysfunction of lumbar region: Secondary | ICD-10-CM | POA: Diagnosis not present

## 2020-04-10 DIAGNOSIS — M9904 Segmental and somatic dysfunction of sacral region: Secondary | ICD-10-CM | POA: Diagnosis not present

## 2020-04-10 DIAGNOSIS — M9905 Segmental and somatic dysfunction of pelvic region: Secondary | ICD-10-CM | POA: Diagnosis not present

## 2020-05-10 ENCOUNTER — Other Ambulatory Visit: Payer: Self-pay | Admitting: Family Medicine

## 2020-06-07 ENCOUNTER — Other Ambulatory Visit: Payer: Self-pay | Admitting: Family Medicine

## 2020-06-07 ENCOUNTER — Other Ambulatory Visit: Payer: Self-pay

## 2020-06-07 MED ORDER — ESCITALOPRAM OXALATE 5 MG PO TABS: 5.0000 mg | ORAL_TABLET | Freq: Every day | ORAL | 0 refills | Status: DC

## 2020-06-07 NOTE — Telephone Encounter (Signed)
[facility] left message to get appt scheduled.

## 2020-06-07 NOTE — Telephone Encounter (Signed)
Requested medication (s) are due for refill today: Yes  Requested medication (s) are on the active medication list: Yes  Last refill:  05/10/20  Future visit scheduled: No  Notes to clinic:  Left pt. Message to make appointment.    Requested Prescriptions  Pending Prescriptions Disp Refills   escitalopram (LEXAPRO) 5 MG tablet [Pharmacy Med Name: ESCITALOPRAM 5 MG TABLET] 30 tablet 0    Sig: TAKE 1 TABLET BY MOUTH EVERY DAY      Psychiatry:  Antidepressants - SSRI Failed - 06/07/2020  1:12 AM      Failed - Valid encounter within last 6 months    Recent Outpatient Visits           6 months ago Upper respiratory tract infection, unspecified type   [facility] [place], Megan P, DO   1 year ago Depression, major, single episode, moderate (HCC)   [facility] [place], Megan P, DO   1 year ago Depression, major, single episode, moderate (HCC)   [facility] [place], [place], DO   2 years ago Impacted cerumen of right ear   [facility] Particia Nearing, [place]   2 years ago Immunization due   [employer], [place], DO                Passed - Completed PHQ-2 or PHQ-9 in the last 360 days

## 2020-06-07 NOTE — Addendum Note (Signed)
Addended by: Pablo Ledger on: 06/07/2020 10:50 AM   Modules accepted: Orders

## 2020-06-07 NOTE — Telephone Encounter (Signed)
Copied from CRM [id number]. Topic: Quick Communication - See Telephone Encounter >> Jun 07, 2020  9:57 AM Aretta Nip wrote: CRM for notification. See Telephone encounter for: 06/07/20. Pt made requested appt by dr for visit for med refill, Pt unable to take the one am appt for 12/23 but has made appt for 1/6. Pt request will at least fill med up till that point 1/6? If not pls return call. At [phone] escitalopram (LEXAPRO) 5 MG tablet 30 tablet 0 05/10/2020   Sig: TAKE 1 TABLET BY MOUTH EVERY DAY  Sent to pharmacy as: escitalopram (LEXAPRO) 5 MG tablet  E-Prescribing Status: Receipt confirmed by pharmacy (05/10/2020 3:25 AM EST)

## 2020-06-07 NOTE — Telephone Encounter (Signed)
Routing to provider. Patient requesting enough medication to last until upcoming appointment.

## 2020-06-21 ENCOUNTER — Telehealth: Payer: Self-pay | Admitting: Family Medicine

## 2020-06-21 ENCOUNTER — Other Ambulatory Visit: Payer: Self-pay | Admitting: Family Medicine

## 2020-06-21 NOTE — Telephone Encounter (Signed)
Patient called to ask if her appt. Tomorrow could be changed to a virtual appt. Since she tested positive for covid.  Please advise and call patient to discuss at [phone]

## 2020-06-21 NOTE — Telephone Encounter (Signed)
   Notes to clinic:  Patient is requesting 23 ay instead of 30    Requested Prescriptions  Pending Prescriptions Disp Refills   escitalopram (LEXAPRO) 5 MG tablet [Pharmacy Med Name: ESCITALOPRAM 5 MG TABLET] 90 tablet 1    Sig: Take 1 tablet (5 mg total) by mouth daily.      Psychiatry:  Antidepressants - SSRI Failed - 06/21/2020 11:15 AM      Failed - Valid encounter within last 6 months    Recent Outpatient Visits           6 months ago Upper respiratory tract infection, unspecified type   [facility], Megan P, DO   1 year ago Depression, major, single episode, moderate (HCC)   [facility] [place], Megan P, DO   1 year ago Depression, major, single episode, moderate (HCC)   [facility] [place], [place], DO   2 years ago Impacted cerumen of right ear   [facility] Particia Nearing, [place]   2 years ago Immunization due   [employer], [place], DO       Future Appointments             Tomorrow Johnson, Megan P, DO [facility], [facility]             Passed - Completed PHQ-2 or PHQ-9 in the last 360 days

## 2020-06-22 ENCOUNTER — Telehealth (INDEPENDENT_AMBULATORY_CARE_PROVIDER_SITE_OTHER): Payer: BC Managed Care – PPO | Admitting: Family Medicine

## 2020-06-22 ENCOUNTER — Encounter: Payer: Self-pay | Admitting: Family Medicine

## 2020-06-22 DIAGNOSIS — U071 COVID-19: Secondary | ICD-10-CM | POA: Diagnosis not present

## 2020-06-22 DIAGNOSIS — F321 Major depressive disorder, single episode, moderate: Secondary | ICD-10-CM

## 2020-06-22 NOTE — Progress Notes (Signed)
There were no vitals taken for this visit.   Subjective:    Patient ID: Danielle Pearson, female    DOB: 1986/04/22, 35 y.o.   MRN: [id number]  HPI: Danielle Pearson is a 35 y.o. female  Chief Complaint  Patient presents with  . Depression   COVID positive on Monday with symptoms on Friday. She is feeling much better now than she was over the weekend and is currently isolating.   DEPRESSION- plateaued several months ago and hasn't really had much change.  Mood status: better Satisfied with current treatment?: would like to come off Symptom severity: mild  Duration of current treatment : months Side effects: no Medication compliance: excellent compliance Psychotherapy/counseling: no  Previous psychiatric medications: lexapro Depressed mood: no Anxious mood: no Anhedonia: no Significant weight loss or gain: no Insomnia: no  Fatigue: no Feelings of worthlessness or guilt: no Impaired concentration/indecisiveness: no Suicidal ideations: no Hopelessness: no Crying spells: no  Depression screen [facility] 2/9 06/22/2020 11/25/2019 05/19/2019 03/23/2019 01/29/2016  Decreased Interest 1 1 1 2  0  Down, Depressed, Hopeless 1 1 1 3  0  PHQ - 2 Score 2 2 2 5  0  Altered sleeping 0 3 2 2  0  Tired, decreased energy 1 1 1 3  0  Change in appetite 0 0 0 2 0  Feeling bad or failure about yourself  0 0 0 2 0  Trouble concentrating 3 0 0 3 0  Moving slowly or fidgety/restless 0 0 0 0 0  Suicidal thoughts 0 0 0 0 0  PHQ-9 Score 6 6 5 17  0  Difficult doing work/chores Somewhat difficult Not difficult at all Not difficult at all Very difficult -    Relevant past medical, surgical, family and social history reviewed and updated as indicated. Interim medical history since our last visit reviewed. Allergies and medications reviewed and updated.  Review of Systems  Constitutional: Negative.   HENT:       Ear pressure  Respiratory: Negative.   Cardiovascular: Negative.   Gastrointestinal:  Negative.   Musculoskeletal: Negative.   Psychiatric/Behavioral: Positive for dysphoric mood. Negative for agitation, behavioral problems, confusion, decreased concentration, hallucinations, self-injury, sleep disturbance and suicidal ideas. The patient is nervous/anxious. The patient is not hyperactive.     Per HPI unless specifically indicated above     Objective:    There were no vitals taken for this visit.  Wt Readings from Last 3 Encounters:  04/05/19 159 lb (72.1 kg)  03/23/19 159 lb (72.1 kg)  08/21/17 165 lb 4.8 oz (75 kg)    Physical Exam Vitals and nursing note reviewed.  Pulmonary:     Effort: Pulmonary effort is normal. No respiratory distress.     Comments: Speaking in full sentences Neurological:     Mental Status: She is alert.  Psychiatric:        Mood and Affect: Mood normal.        Behavior: Behavior normal.        Thought Content: Thought content normal.        Judgment: Judgment normal.     Results for orders placed or performed during the hospital encounter of 10/11/19  Novel Coronavirus, NAA ([facility])   Specimen: Nasal Swab; Nasopharyngeal(NP) swabs in vial transport medium   NASOPHARYNGE  PATIENT  Result Value Ref Range   SARS-CoV-2, NAA Not Detected Not Detected  SARS-COV-2, NAA 2 DAY TAT   NASOPHARYNGE  PATIENT  Result Value Ref Range   SARS-CoV-2, NAA 2 DAY  TAT Performed   POC SARS Coronavirus 2 Ag-ED - Nasal Swab (BD Veritor Kit)  Result Value Ref Range   SARS Coronavirus 2 Ag Negative Negative      Assessment & Plan:   Problem List Items Addressed This Visit      Other   Depression, major, single episode, moderate ([place])    Would like to come off her medicine. Will take 1/2 tab of lexapro for 1 week, then decrease to 1/2 tab every other day then stop. Call if having any issues or mood worsens. Does not need refills to come off right now. Call with any concerns.        Other Visit Diagnoses    COVID-19    -  Primary   Feeling well  on day 6. Continue symptomatic treatment. Call with any concerns.        Follow up plan: Return Physical.    . This visit was completed via telephone due to the restrictions of the COVID-19 pandemic. All issues as above were discussed and addressed but no physical exam was performed. If it was felt that the patient should be evaluated in the office, they were directed there. The patient verbally consented to this visit. Patient was unable to complete an audio/visual visit due to Lack of equipment. Due to the catastrophic nature of the COVID-19 pandemic, this visit was done through audio contact only. . Location of the patient: home . Location of the provider: work . Those involved with this call:  . Provider: Park Liter, DO . CMA: Louanna Raw, [place] . Front Desk/Registration: Barth Kirks  . Time spent on call: 21 minutes on the phone discussing health concerns. 30 minutes total spent in review of patient's record and preparation of their chart.

## 2020-06-22 NOTE — Assessment & Plan Note (Signed)
Would like to come off her medicine. Will take 1/2 tab of lexapro for 1 week, then decrease to 1/2 tab every other day then stop. Call if having any issues or mood worsens. Does not need refills to come off right now. Call with any concerns.

## 2020-07-04 DIAGNOSIS — M9903 Segmental and somatic dysfunction of lumbar region: Secondary | ICD-10-CM | POA: Diagnosis not present

## 2020-07-04 DIAGNOSIS — M62830 Muscle spasm of back: Secondary | ICD-10-CM | POA: Diagnosis not present

## 2020-07-04 DIAGNOSIS — M9904 Segmental and somatic dysfunction of sacral region: Secondary | ICD-10-CM | POA: Diagnosis not present

## 2020-07-04 DIAGNOSIS — M9905 Segmental and somatic dysfunction of pelvic region: Secondary | ICD-10-CM | POA: Diagnosis not present

## 2020-07-06 DIAGNOSIS — M9904 Segmental and somatic dysfunction of sacral region: Secondary | ICD-10-CM | POA: Diagnosis not present

## 2020-07-06 DIAGNOSIS — M9905 Segmental and somatic dysfunction of pelvic region: Secondary | ICD-10-CM | POA: Diagnosis not present

## 2020-07-06 DIAGNOSIS — M9903 Segmental and somatic dysfunction of lumbar region: Secondary | ICD-10-CM | POA: Diagnosis not present

## 2020-07-06 DIAGNOSIS — M62830 Muscle spasm of back: Secondary | ICD-10-CM | POA: Diagnosis not present

## 2020-07-10 DIAGNOSIS — M62830 Muscle spasm of back: Secondary | ICD-10-CM | POA: Diagnosis not present

## 2020-07-10 DIAGNOSIS — M9905 Segmental and somatic dysfunction of pelvic region: Secondary | ICD-10-CM | POA: Diagnosis not present

## 2020-07-10 DIAGNOSIS — M9904 Segmental and somatic dysfunction of sacral region: Secondary | ICD-10-CM | POA: Diagnosis not present

## 2020-07-10 DIAGNOSIS — M9903 Segmental and somatic dysfunction of lumbar region: Secondary | ICD-10-CM | POA: Diagnosis not present

## 2020-07-13 DIAGNOSIS — M9905 Segmental and somatic dysfunction of pelvic region: Secondary | ICD-10-CM | POA: Diagnosis not present

## 2020-07-13 DIAGNOSIS — M9903 Segmental and somatic dysfunction of lumbar region: Secondary | ICD-10-CM | POA: Diagnosis not present

## 2020-07-13 DIAGNOSIS — M62830 Muscle spasm of back: Secondary | ICD-10-CM | POA: Diagnosis not present

## 2020-07-13 DIAGNOSIS — M9904 Segmental and somatic dysfunction of sacral region: Secondary | ICD-10-CM | POA: Diagnosis not present

## 2020-07-18 DIAGNOSIS — M9905 Segmental and somatic dysfunction of pelvic region: Secondary | ICD-10-CM | POA: Diagnosis not present

## 2020-07-18 DIAGNOSIS — M62830 Muscle spasm of back: Secondary | ICD-10-CM | POA: Diagnosis not present

## 2020-07-18 DIAGNOSIS — M9903 Segmental and somatic dysfunction of lumbar region: Secondary | ICD-10-CM | POA: Diagnosis not present

## 2020-07-18 DIAGNOSIS — M9904 Segmental and somatic dysfunction of sacral region: Secondary | ICD-10-CM | POA: Diagnosis not present

## 2020-07-24 DIAGNOSIS — M62830 Muscle spasm of back: Secondary | ICD-10-CM | POA: Diagnosis not present

## 2020-07-24 DIAGNOSIS — M9904 Segmental and somatic dysfunction of sacral region: Secondary | ICD-10-CM | POA: Diagnosis not present

## 2020-07-24 DIAGNOSIS — M9905 Segmental and somatic dysfunction of pelvic region: Secondary | ICD-10-CM | POA: Diagnosis not present

## 2020-07-24 DIAGNOSIS — M9903 Segmental and somatic dysfunction of lumbar region: Secondary | ICD-10-CM | POA: Diagnosis not present

## 2020-07-26 DIAGNOSIS — M9903 Segmental and somatic dysfunction of lumbar region: Secondary | ICD-10-CM | POA: Diagnosis not present

## 2020-07-26 DIAGNOSIS — M9905 Segmental and somatic dysfunction of pelvic region: Secondary | ICD-10-CM | POA: Diagnosis not present

## 2020-07-26 DIAGNOSIS — M62830 Muscle spasm of back: Secondary | ICD-10-CM | POA: Diagnosis not present

## 2020-07-26 DIAGNOSIS — M9904 Segmental and somatic dysfunction of sacral region: Secondary | ICD-10-CM | POA: Diagnosis not present

## 2020-08-02 DIAGNOSIS — M9904 Segmental and somatic dysfunction of sacral region: Secondary | ICD-10-CM | POA: Diagnosis not present

## 2020-08-02 DIAGNOSIS — M9903 Segmental and somatic dysfunction of lumbar region: Secondary | ICD-10-CM | POA: Diagnosis not present

## 2020-08-02 DIAGNOSIS — M62830 Muscle spasm of back: Secondary | ICD-10-CM | POA: Diagnosis not present

## 2020-08-02 DIAGNOSIS — M9905 Segmental and somatic dysfunction of pelvic region: Secondary | ICD-10-CM | POA: Diagnosis not present

## 2020-08-04 ENCOUNTER — Encounter: Payer: BC Managed Care – PPO | Admitting: Family Medicine

## 2020-09-06 DIAGNOSIS — M62830 Muscle spasm of back: Secondary | ICD-10-CM | POA: Diagnosis not present

## 2020-09-06 DIAGNOSIS — M9905 Segmental and somatic dysfunction of pelvic region: Secondary | ICD-10-CM | POA: Diagnosis not present

## 2020-09-06 DIAGNOSIS — M9903 Segmental and somatic dysfunction of lumbar region: Secondary | ICD-10-CM | POA: Diagnosis not present

## 2020-09-06 DIAGNOSIS — M9904 Segmental and somatic dysfunction of sacral region: Secondary | ICD-10-CM | POA: Diagnosis not present

## 2020-09-19 DIAGNOSIS — M62830 Muscle spasm of back: Secondary | ICD-10-CM | POA: Diagnosis not present

## 2020-09-19 DIAGNOSIS — M9904 Segmental and somatic dysfunction of sacral region: Secondary | ICD-10-CM | POA: Diagnosis not present

## 2020-09-19 DIAGNOSIS — M9903 Segmental and somatic dysfunction of lumbar region: Secondary | ICD-10-CM | POA: Diagnosis not present

## 2020-09-19 DIAGNOSIS — M9905 Segmental and somatic dysfunction of pelvic region: Secondary | ICD-10-CM | POA: Diagnosis not present

## 2020-10-04 DIAGNOSIS — M9905 Segmental and somatic dysfunction of pelvic region: Secondary | ICD-10-CM | POA: Diagnosis not present

## 2020-10-04 DIAGNOSIS — M62830 Muscle spasm of back: Secondary | ICD-10-CM | POA: Diagnosis not present

## 2020-10-04 DIAGNOSIS — M9903 Segmental and somatic dysfunction of lumbar region: Secondary | ICD-10-CM | POA: Diagnosis not present

## 2020-10-04 DIAGNOSIS — M9904 Segmental and somatic dysfunction of sacral region: Secondary | ICD-10-CM | POA: Diagnosis not present

## 2020-10-11 ENCOUNTER — Other Ambulatory Visit: Payer: Self-pay

## 2020-10-11 ENCOUNTER — Encounter: Payer: Self-pay | Admitting: Obstetrics and Gynecology

## 2020-10-11 ENCOUNTER — Ambulatory Visit (INDEPENDENT_AMBULATORY_CARE_PROVIDER_SITE_OTHER): Payer: BC Managed Care – PPO | Admitting: Obstetrics and Gynecology

## 2020-10-11 ENCOUNTER — Other Ambulatory Visit (HOSPITAL_COMMUNITY)
Admission: RE | Admit: 2020-10-11 | Discharge: 2020-10-11 | Disposition: A | Discharge status: Home / Self Care | Payer: BC Managed Care – PPO | Source: Ambulatory Visit | Attending: Obstetrics and Gynecology | Admitting: Obstetrics and Gynecology

## 2020-10-11 VITALS — BP 110/84 | Ht 65.0 in | Wt 166.0 lb

## 2020-10-11 DIAGNOSIS — N920 Excessive and frequent menstruation with regular cycle: Secondary | ICD-10-CM

## 2020-10-11 DIAGNOSIS — Z01419 Encounter for gynecological examination (general) (routine) without abnormal findings: Secondary | ICD-10-CM | POA: Diagnosis not present

## 2020-10-11 DIAGNOSIS — Z124 Encounter for screening for malignant neoplasm of cervix: Secondary | ICD-10-CM

## 2020-10-11 DIAGNOSIS — Z30011 Encounter for initial prescription of contraceptive pills: Secondary | ICD-10-CM | POA: Diagnosis not present

## 2020-10-11 DIAGNOSIS — Z1151 Encounter for screening for human papillomavirus (HPV): Secondary | ICD-10-CM | POA: Diagnosis not present

## 2020-10-11 DIAGNOSIS — Z1329 Encounter for screening for other suspected endocrine disorder: Secondary | ICD-10-CM | POA: Diagnosis not present

## 2020-10-11 MED ORDER — DROSPIRENONE-ETHINYL ESTRADIOL 3-0.02 MG PO TABS: 1.0000 | ORAL_TABLET | Freq: Every day | ORAL | 3 refills | Status: DC

## 2020-10-11 NOTE — Patient Instructions (Signed)
I value your feedback and you entrusting us with your care. If you get a [facility] patient survey, I would appreciate you taking the time to let us know about your experience today. Thank you! ? ? ?

## 2020-10-11 NOTE — Progress Notes (Signed)
PCP:  [name], DO   Chief Complaint  Patient presents with  . Gynecologic Exam    Cycles are heavier than usual, lasting 4-5 days     HPI:      [name] is a 35 y.o. No obstetric history on file. who LMP was Patient's last menstrual period was 10/01/2020 (exact date)., presents today for her annual examination.  Her menses are regular every 28-30 days, lasting 4-5 days, mod flow now, used to be 1-2 days light spotting.  Dysmenorrhea mild, occurring first 1-2 days of flow; a little more intense now than previously, but still not bad. She does not have intermenstrual bleeding. Pt also having emotional lability and increased cystic acne. Has been under increased stress. No recent thyroid check.   Sex activity: single partner, contraception - condoms occas, may be interested in [facility] for cycles, acne, moods. Did IUDs for many yrs, doesn't want another since last one was embedded and removal was painful.  Last Pap: 04/07/17  Results were: no abnormalities /neg HPV DNA 2017.  Hx of STDs: none  There is no FH of breast cancer. There is no FH of ovarian cancer. The patient does not do self-breast exams.  Tobacco use: quit Alcohol use: social No drug use.  Exercise: mod active  She does get adequate calcium but not Vitamin D in her diet.   Past Medical History:  Diagnosis Date  . Tobacco abuse     Past Surgical History:  Procedure Laterality Date  . WISDOM TOOTH EXTRACTION      Family History  Problem Relation Age of Onset  . Hypertension Father     Social History   Socioeconomic History  . Marital status: Married    Spouse name: Not on file  . Number of children: Not on file  . Years of education: some colle  . Highest education level: Not on file  Occupational History  . Occupation: Bank Teller  Tobacco Use  . Smoking status: Former Smoker    Types: Cigarettes  . Smokeless tobacco: Never Used  Vaping Use  . Vaping Use: Every day  Substance  and Sexual Activity  . Alcohol use: Yes    Alcohol/week: 0.0 standard drinks    Comment: Socially  . Drug use: No  . Sexual activity: Yes    Birth control/protection: None, Condom  Other Topics Concern  . Not on file  Social History Narrative  . Not on file   Social Determinants of Health   Financial Resource Strain: Not on file  Food Insecurity: Not on file  Transportation Needs: Not on file  Physical Activity: Not on file  Stress: Not on file  Social Connections: Not on file  Intimate Partner Violence: Not on file    Current Meds  Medication Sig  . drospirenone-ethinyl estradiol (YAZ) 3-0.02 MG tablet Take 1 tablet by mouth daily.     ROS:  Review of Systems  Constitutional: Negative for fatigue, fever and unexpected weight change.  Respiratory: Negative for cough, shortness of breath and wheezing.   Cardiovascular: Negative for chest pain, palpitations and leg swelling.  Gastrointestinal: Negative for blood in stool, constipation, diarrhea, nausea and vomiting.  Endocrine: Negative for cold intolerance, heat intolerance and polyuria.  Genitourinary: Negative for dyspareunia, dysuria, flank pain, frequency, genital sores, hematuria, menstrual problem, pelvic pain, urgency, vaginal bleeding, vaginal discharge and vaginal pain.  Musculoskeletal: Negative for back pain, joint swelling and myalgias.  Skin: Negative for rash.  Neurological: Negative  for dizziness, syncope, light-headedness, numbness and headaches.  Hematological: Negative for adenopathy.  Psychiatric/Behavioral: Negative for agitation, confusion, sleep disturbance and suicidal ideas. The patient is not nervous/anxious.      Objective: BP 110/84   Ht 5\' 5"  (1.651 m)   Wt 166 lb (75.3 kg)   LMP 10/01/2020 (Exact Date)   BMI 27.62 kg/m    Physical Exam Constitutional:      Appearance: She is well-developed.  Genitourinary:     Vulva normal.     Right Labia: No rash, tenderness or lesions.     Left Labia: No tenderness, lesions or rash.    No vaginal discharge, erythema or tenderness.      Right Adnexa: not tender and no mass present.    Left Adnexa: not tender and no mass present.    No cervical motion tenderness, friability or polyp.     Uterus is not enlarged or tender.  Breasts:     Right: No mass, nipple discharge, skin change or tenderness.     Left: No mass, nipple discharge, skin change or tenderness.    Neck:     Thyroid: No thyromegaly.  Cardiovascular:     Rate and Rhythm: Normal rate and regular rhythm.     Heart sounds: Normal heart sounds. No murmur heard.   Pulmonary:     Effort: Pulmonary effort is normal.     Breath sounds: Normal breath sounds.  Abdominal:     Palpations: Abdomen is soft.     Tenderness: There is no abdominal tenderness. There is no guarding or rebound.  Musculoskeletal:        General: Normal range of motion.     Cervical back: Normal range of motion.  Lymphadenopathy:     Cervical: No cervical adenopathy.  Neurological:     General: No focal deficit present.     Mental Status: She is alert and oriented to person, place, and time.     Cranial Nerves: No cranial nerve deficit.  Skin:    General: Skin is warm and dry.  Psychiatric:        Mood and Affect: Mood normal.        Behavior: Behavior normal.        Thought Content: Thought content normal.        Judgment: Judgment normal.  Vitals reviewed.     Assessment/Plan: Encounter for annual routine gynecological examination  Cervical cancer screening - Plan: Cytology - PAP  Screening for HPV (human papillomavirus) - Plan: Cytology - PAP  Encounter for initial prescription of contraceptive pills - Plan: drospirenone-ethinyl estradiol (YAZ) 3-0.02 MG tablet; OCP start with next menses, condoms. Rx eRxd. See if helps cycle, acne, and emotions. F/u prn.   Menorrhagia with regular cycle - Plan: TSH, drospirenone-ethinyl estradiol (YAZ) 3-0.02 MG tablet; normal flow per GYN  standards but heavier for pt. Rule out thyroid, start OCPs. If sx persist, will check GYN u/s. Reassurance.   Thyroid disorder screening - Plan: TSH  Meds ordered this encounter  Medications  . drospirenone-ethinyl estradiol (YAZ) 3-0.02 MG tablet    Sig: Take 1 tablet by mouth daily.    Dispense:  84 tablet    Refill:  3    Order Specific Question:   Supervising Provider    Answer:   07-04-1996 Nadara Mustard     GYN counsel adequate intake of calcium and vitamin D, diet and exercise     F/U  Return in about 1 year (around 10/11/2021).  Danielle Pearson B. Stephenia Vogan, PA-C 10/11/2020 2:22 PM

## 2020-10-12 LAB — TSH: TSH: 1.7 u[IU]/mL (ref 0.450–4.500)

## 2020-10-16 LAB — CYTOLOGY - PAP
Comment: NEGATIVE
Diagnosis: NEGATIVE
High risk HPV: NEGATIVE

## 2020-12-15 DIAGNOSIS — M62830 Muscle spasm of back: Secondary | ICD-10-CM | POA: Diagnosis not present

## 2020-12-15 DIAGNOSIS — M9903 Segmental and somatic dysfunction of lumbar region: Secondary | ICD-10-CM | POA: Diagnosis not present

## 2020-12-15 DIAGNOSIS — M9905 Segmental and somatic dysfunction of pelvic region: Secondary | ICD-10-CM | POA: Diagnosis not present

## 2020-12-15 DIAGNOSIS — M9904 Segmental and somatic dysfunction of sacral region: Secondary | ICD-10-CM | POA: Diagnosis not present

## 2021-01-29 DIAGNOSIS — M62830 Muscle spasm of back: Secondary | ICD-10-CM | POA: Diagnosis not present

## 2021-01-29 DIAGNOSIS — M9905 Segmental and somatic dysfunction of pelvic region: Secondary | ICD-10-CM | POA: Diagnosis not present

## 2021-01-29 DIAGNOSIS — M9904 Segmental and somatic dysfunction of sacral region: Secondary | ICD-10-CM | POA: Diagnosis not present

## 2021-01-29 DIAGNOSIS — M9903 Segmental and somatic dysfunction of lumbar region: Secondary | ICD-10-CM | POA: Diagnosis not present

## 2021-02-23 DIAGNOSIS — M62830 Muscle spasm of back: Secondary | ICD-10-CM | POA: Diagnosis not present

## 2021-02-23 DIAGNOSIS — M9903 Segmental and somatic dysfunction of lumbar region: Secondary | ICD-10-CM | POA: Diagnosis not present

## 2021-02-23 DIAGNOSIS — M9905 Segmental and somatic dysfunction of pelvic region: Secondary | ICD-10-CM | POA: Diagnosis not present

## 2021-02-23 DIAGNOSIS — M9904 Segmental and somatic dysfunction of sacral region: Secondary | ICD-10-CM | POA: Diagnosis not present

## 2021-04-06 ENCOUNTER — Encounter: Payer: Self-pay | Admitting: Nurse Practitioner

## 2021-04-06 ENCOUNTER — Telehealth: Payer: Self-pay | Admitting: Nurse Practitioner

## 2021-04-06 NOTE — Telephone Encounter (Signed)
Copied from CRM [id number]. Topic: General - Other >> Apr 06, 2021 11:30 AM Glean Salen wrote: Reason for [id number] states needs letter for work for sickness, if she can possibly get it today. Please call back

## 2021-04-06 NOTE — Telephone Encounter (Signed)
Pt called back saying she was thinking she has an appt today for her symptoms but it was actually for Monday.  She needs a note for being out of work today for cold  like symptoms.  CB#  [phone]

## 2021-04-09 ENCOUNTER — Telehealth: Payer: BC Managed Care – PPO | Admitting: Nurse Practitioner

## 2021-04-11 DIAGNOSIS — M62830 Muscle spasm of back: Secondary | ICD-10-CM | POA: Diagnosis not present

## 2021-04-11 DIAGNOSIS — M9903 Segmental and somatic dysfunction of lumbar region: Secondary | ICD-10-CM | POA: Diagnosis not present

## 2021-04-11 DIAGNOSIS — M9905 Segmental and somatic dysfunction of pelvic region: Secondary | ICD-10-CM | POA: Diagnosis not present

## 2021-04-11 DIAGNOSIS — M9904 Segmental and somatic dysfunction of sacral region: Secondary | ICD-10-CM | POA: Diagnosis not present

## 2021-04-16 ENCOUNTER — Other Ambulatory Visit: Payer: Self-pay

## 2021-04-16 ENCOUNTER — Encounter: Payer: Self-pay | Admitting: Obstetrics

## 2021-04-16 ENCOUNTER — Ambulatory Visit (INDEPENDENT_AMBULATORY_CARE_PROVIDER_SITE_OTHER): Payer: BC Managed Care – PPO | Admitting: Obstetrics

## 2021-04-16 VITALS — BP 120/70 | Ht 65.0 in | Wt 157.8 lb

## 2021-04-16 DIAGNOSIS — N898 Other specified noninflammatory disorders of vagina: Secondary | ICD-10-CM

## 2021-04-16 DIAGNOSIS — N907 Vulvar cyst: Secondary | ICD-10-CM | POA: Diagnosis not present

## 2021-04-16 DIAGNOSIS — B3731 Acute candidiasis of vulva and vagina: Secondary | ICD-10-CM

## 2021-04-16 MED ORDER — CEPHALEXIN 500 MG PO CAPS: 500.0000 mg | ORAL_CAPSULE | Freq: Four times a day (QID) | ORAL | 2 refills | Status: DC

## 2021-04-16 MED ORDER — FLUCONAZOLE 150 MG PO TABS: 150.0000 mg | ORAL_TABLET | Freq: Once | ORAL | 0 refills | Status: AC

## 2021-04-16 NOTE — Progress Notes (Signed)
Obstetrics & Gynecology Office Visit   Chief Complaint:  Chief Complaint  Patient presents with   Gynecologic Exam    Cyst in the vaginal area week now. Its very painful and swollen.    History of Present Illness: Danielle Pearson presents with c/o a "cyst" that developed over the last week or so on the right labia minora. At first, she thought this might have occurred after sex, but the    Review of Systems:  Review of Systems  Constitutional: Negative.   HENT: Negative.    Eyes: Negative.   Respiratory: Negative.    Cardiovascular: Negative.   Gastrointestinal: Negative.   Genitourinary: Negative.   Musculoskeletal: Negative.   Skin:        Labial bump over last week.   Neurological: Negative.   Endo/Heme/Allergies: Negative.     Past Medical History:  Past Medical History:  Diagnosis Date   Tobacco abuse     Past Surgical History:  Past Surgical History:  Procedure Laterality Date   WISDOM TOOTH EXTRACTION      Gynecologic History: Patient's last menstrual period was 04/10/2021.  Obstetric History: G1P0010  Family History:  Family History  Problem Relation Age of Onset   Hypertension Father     Social History:  Social History   Socioeconomic History   Marital status: Married    Spouse name: Not on file   Number of children: Not on file   Years of education: some colle   Highest education level: Not on file  Occupational History   Occupation: Bank Teller  Tobacco Use   Smoking status: Former    Types: Cigarettes   Smokeless tobacco: Never  Vaping Use   Vaping Use: Every day  Substance and Sexual Activity   Alcohol use: Yes    Alcohol/week: 0.0 standard drinks    Comment: Socially   Drug use: No   Sexual activity: Yes    Birth control/protection: None, Condom  Other Topics Concern   Not on file  Social History Narrative   Not on file   Social Determinants of Health   Financial Resource Strain: Not on file  Food Insecurity: Not on file   Transportation Needs: Not on file  Physical Activity: Not on file  Stress: Not on file  Social Connections: Not on file  Intimate Partner Violence: Not on file    Allergies:  No Known Allergies  Medications: Prior to Admission medications   Medication Sig Start Date End Date Taking? Authorizing Provider  drospirenone-ethinyl estradiol (YAZ) 3-0.02 MG tablet Take 1 tablet by mouth daily. 10/11/20   Copland, Ilona Sorrel, PA-C  traZODone (DESYREL) 50 MG tablet TAKE 0.5-1 TABLETS (25-50 MG TOTAL) BY MOUTH AT BEDTIME AS NEEDED FOR SLEEP. 07/25/19 10/11/19  [name], DO    Physical Exam Vitals:  Vitals:   04/16/21 1554  BP: 120/70   Patient's last menstrual period was 04/10/2021.  Physical Exam Constitutional:      Appearance: Normal appearance.  HENT:     Head: Normocephalic and atraumatic.     Nose: Nose normal.  Cardiovascular:     Rate and Rhythm: Normal rate and regular rhythm.  Pulmonary:     Effort: Pulmonary effort is normal.     Breath sounds: Normal breath sounds.  Abdominal:     General: Abdomen is flat.     Palpations: Abdomen is soft.  Genitourinary:    Comments: No lesions or irritation. Along the right labia minora, there is a round ,  hard area within the labial tissue. No "head' noted, and it is non tender. No vaginal discharge. Musculoskeletal:        General: Normal range of motion.  Skin:    General: Skin is warm and dry.  Neurological:     General: No focal deficit present.     Mental Status: She is alert and oriented to person, place, and time.  Psychiatric:        Mood and Affect: Mood normal.        Behavior: Behavior normal.     Assessment: 35 y.o. G1P0010  with right labial mass  Plan: Problem List Items Addressed This Visit   None Visit Diagnoses     Vaginal cyst    -  Primary     Keflex QID for 5 days Diflucan RX sent as well She will soak with warm wash cloths or do tub soaks BID. F/U if no improvement.  Mirna Mires,  CNM  04/16/2021 4:29 PM

## 2021-10-10 ENCOUNTER — Other Ambulatory Visit: Payer: Self-pay | Admitting: Obstetrics and Gynecology

## 2021-10-10 DIAGNOSIS — N920 Excessive and frequent menstruation with regular cycle: Secondary | ICD-10-CM

## 2021-10-10 DIAGNOSIS — Z30011 Encounter for initial prescription of contraceptive pills: Secondary | ICD-10-CM

## 2021-10-24 ENCOUNTER — Telehealth: Payer: Self-pay

## 2021-10-24 MED ORDER — NIKKI 3-0.02 MG PO TABS: 1.0000 | ORAL_TABLET | Freq: Every day | ORAL | 0 refills | Status: DC

## 2021-10-24 NOTE — Telephone Encounter (Signed)
Pt left msg on triage requesting BC RF. She's in the process of moving to [facility] and has not found a GYN clinic yet. Per [facility], it's to RF 3 month supply. Pt aware. [facility] RF sent. ?

## 2021-11-01 ENCOUNTER — Other Ambulatory Visit: Payer: Self-pay | Admitting: Obstetrics and Gynecology

## 2021-11-01 NOTE — Telephone Encounter (Signed)
Patient aware.

## 2021-11-01 NOTE — Telephone Encounter (Signed)
Spoke w/[facility] Pharmacist. Requested rx to be transferred to [facility] [phone].

## 2021-11-01 NOTE — Telephone Encounter (Signed)
Patient is calling needing to [facility] [address] Please advise?

## 2022-01-21 ENCOUNTER — Other Ambulatory Visit: Payer: Self-pay | Admitting: Obstetrics and Gynecology

## 2022-02-27 NOTE — Progress Notes (Signed)
PCP:  [name], DO   Chief Complaint  Patient presents with   Gynecologic Exam    No concerns     HPI:      [name] is a 36 y.o. G1P0010 who LMP was Patient's last menstrual period was 02/22/2022 (exact date)., presents today for her annual examination.  Her menses are regular every 28-30 days, lasting 2 days on OCPs, mod flow now, used to be 1-2 days light spotting.  Dysmenorrhea none.  She occas has intermenstrual bleeding. Pt was having emotional lability and increased cystic acne and started OCPs last yr with sx improvement.   Sex activity: single partner, contraception - OCPS, doing well. Did IUDs for many yrs, doesn't want another since last one was embedded and removal was painful.  Last Pap: 10/11/20 Results were: no abnormalities /neg HPV DNA   Hx of STDs: none  There is no FH of breast cancer. There is no FH of ovarian cancer. The patient does not do self-breast exams.  Tobacco use: quit Alcohol use: social No drug use.  Exercise: min active  She does get adequate calcium but not Vitamin D in her diet.   Past Medical History:  Diagnosis Date   Tobacco abuse     Past Surgical History:  Procedure Laterality Date   WISDOM TOOTH EXTRACTION      Family History  Problem Relation Age of Onset   Hypertension Father     Social History   Socioeconomic History   Marital status: Married    Spouse name: Not on file   Number of children: Not on file   Years of education: some colle   Highest education level: Not on file  Occupational History   Occupation: Bank Teller  Tobacco Use   Smoking status: Former    Types: Cigarettes   Smokeless tobacco: Never  Vaping Use   Vaping Use: Every day  Substance and Sexual Activity   Alcohol use: Yes    Alcohol/week: 0.0 standard drinks of alcohol    Comment: Socially   Drug use: No   Sexual activity: Yes    Birth control/protection: Pill  Other Topics Concern   Not on file  Social History  Narrative   Not on file   Social Determinants of Health   Financial Resource Strain: Not on file  Food Insecurity: Not on file  Transportation Needs: Not on file  Physical Activity: Not on file  Stress: Not on file  Social Connections: Not on file  Intimate Partner Violence: Not on file    Current Meds  Medication Sig   [DISCONTINUED] NIKKI 3-0.02 MG tablet TAKE 1 TABLET BY MOUTH ONCE A DAY     ROS:  Review of Systems  Constitutional:  Negative for fatigue, fever and unexpected weight change.  Respiratory:  Negative for cough, shortness of breath and wheezing.   Cardiovascular:  Negative for chest pain, palpitations and leg swelling.  Gastrointestinal:  Negative for blood in stool, constipation, diarrhea, nausea and vomiting.  Endocrine: Negative for cold intolerance, heat intolerance and polyuria.  Genitourinary:  Negative for dyspareunia, dysuria, flank pain, frequency, genital sores, hematuria, menstrual problem, pelvic pain, urgency, vaginal bleeding, vaginal discharge and vaginal pain.  Musculoskeletal:  Negative for back pain, joint swelling and myalgias.  Skin:  Negative for rash.  Neurological:  Negative for dizziness, syncope, light-headedness, numbness and headaches.  Hematological:  Negative for adenopathy.  Psychiatric/Behavioral:  Negative for agitation, confusion, sleep disturbance and suicidal ideas. The patient  is not nervous/anxious.      Objective: BP 120/80   Ht 5\' 5"  (1.651 m)   Wt 175 lb (79.4 kg)   LMP 02/22/2022 (Exact Date)   BMI 29.12 kg/m    Physical Exam Constitutional:      Appearance: She is well-developed.  Genitourinary:     Vulva normal.     Right Labia: No rash, tenderness or lesions.    Left Labia: No tenderness, lesions or rash.    No vaginal discharge, erythema or tenderness.      Right Adnexa: not tender and no mass present.    Left Adnexa: not tender and no mass present.    No cervical motion tenderness, friability or  polyp.     Uterus is not enlarged or tender.  Breasts:    Right: No mass, nipple discharge, skin change or tenderness.     Left: No mass, nipple discharge, skin change or tenderness.  Neck:     Thyroid: No thyromegaly.  Cardiovascular:     Rate and Rhythm: Normal rate and regular rhythm.     Heart sounds: Normal heart sounds. No murmur heard. Pulmonary:     Effort: Pulmonary effort is normal.     Breath sounds: Normal breath sounds.  Abdominal:     Palpations: Abdomen is soft.     Tenderness: There is no abdominal tenderness. There is no guarding or rebound.  Musculoskeletal:        General: Normal range of motion.     Cervical back: Normal range of motion.  Lymphadenopathy:     Cervical: No cervical adenopathy.  Neurological:     General: No focal deficit present.     Mental Status: She is alert and oriented to person, place, and time.     Cranial Nerves: No cranial nerve deficit.  Skin:    General: Skin is warm and dry.  Psychiatric:        Mood and Affect: Mood normal.        Behavior: Behavior normal.        Thought Content: Thought content normal.        Judgment: Judgment normal.  Vitals reviewed.     Assessment/Plan: Encounter for annual routine gynecological examination  Encounter for surveillance of contraceptive pills - Plan: drospirenone-ethinyl estradiol (NIKKI) 3-0.02 MG tablet; OCP RF  Menorrhagia with regular cycle - Plan: drospirenone-ethinyl estradiol (NIKKI) 3-0.02 MG tablet; improved with OCPs, f/u prn   Meds ordered this encounter  Medications   drospirenone-ethinyl estradiol (NIKKI) 3-0.02 MG tablet    Sig: Take 1 tablet by mouth daily.    Dispense:  84 tablet    Refill:  3    Order Specific Question:   Supervising Provider    Answer:   07-04-1996     GYN counsel adequate intake of calcium and vitamin D, diet and exercise     F/U  Return in about 1 year (around 03/01/2023).  Giang Hemme B. Lasaundra Riche, PA-C 02/28/2022 11:50 AM

## 2022-02-28 ENCOUNTER — Encounter: Payer: Self-pay | Admitting: Obstetrics and Gynecology

## 2022-02-28 ENCOUNTER — Ambulatory Visit (INDEPENDENT_AMBULATORY_CARE_PROVIDER_SITE_OTHER): Payer: 59 | Admitting: Obstetrics and Gynecology

## 2022-02-28 VITALS — BP 120/80 | Ht 65.0 in | Wt 175.0 lb

## 2022-02-28 DIAGNOSIS — N920 Excessive and frequent menstruation with regular cycle: Secondary | ICD-10-CM

## 2022-02-28 DIAGNOSIS — Z3041 Encounter for surveillance of contraceptive pills: Secondary | ICD-10-CM | POA: Diagnosis not present

## 2022-02-28 DIAGNOSIS — Z01419 Encounter for gynecological examination (general) (routine) without abnormal findings: Secondary | ICD-10-CM | POA: Diagnosis not present

## 2022-02-28 MED ORDER — DROSPIRENONE-ETHINYL ESTRADIOL 3-0.02 MG PO TABS: 1.0000 | ORAL_TABLET | Freq: Every day | ORAL | 3 refills | Status: DC

## 2022-02-28 NOTE — Patient Instructions (Signed)
I value your feedback and you entrusting us with your care. If you get a [facility] patient survey, I would appreciate you taking the time to let us know about your experience today. Thank you! ? ? ?

## 2022-06-07 ENCOUNTER — Ambulatory Visit: Payer: Self-pay | Admitting: *Deleted

## 2022-06-07 NOTE — Telephone Encounter (Signed)
  Chief Complaint: viral symptoms Symptoms: cough, body aches, sore throat, headache Frequency: symptoms started Tuesday evening Pertinent Negatives: Patient denies fever, SOB at this time Disposition: [] ED /[x] Urgent Care (no appt availability in office) / [] Appointment(In office/virtual)/ []  [facility]/ [] Home Care/ [] Refused Recommended Disposition /[] [facility]/ []  Follow-up with PCP Additional Notes: No open appointment in office- patient given options for care- UC/virtual UC- patient will need one of these to obtain note for work

## 2022-06-07 NOTE — Telephone Encounter (Signed)
Summary: flu and appt   Pt thinks she has the flu / she has missed work and called to get an appt today due to missing 3 days of work / please advise         Reason for Disposition  [1] Using nasal washes and pain medicine > 24 hours AND [2] sinus pain (around cheekbone or eye) persists  Answer Assessment - Initial Assessment Questions 1. WORST SYMPTOM: "What is your worst symptom?" (e.g., cough, runny nose, muscle aches, headache, sore throat, fever)      Cough, congestion, body aches, possible fever 2. ONSET: "When did your flu symptoms start?"      Tuesday evening- family member had flu 3. COUGH: "How bad is the cough?"       Deep pain cough- mucus- clear/white 4. RESPIRATORY DISTRESS: "Describe your breathing."      Sore,tight 5. FEVER: "Do you have a fever?" If Yes, ask: "What is your temperature, how was it measured, and when did it start?"     No fever- using Tylenol 6. EXPOSURE: "Were you exposed to someone with influenza?"       Possible exposure 7. FLU VACCINE: "Did you get a flu shot this year?"     no 8. HIGH RISK DISEASE: "Do you have any chronic medical problems?" (e.g., heart or lung disease, asthma, weak immune system, or other HIGH RISK conditions)     no 9. PREGNANCY: "Is there any chance you are pregnant?" "When was your last menstrual period?"     No- LMP-normal cycle 10. OTHER SYMPTOMS: "Do you have any other symptoms?"  (e.g., runny nose, muscle aches, headache, sore throat)       Sore throat, headache  Protocols used: Influenza - [facility]

## 2022-06-14 ENCOUNTER — Encounter: Payer: Self-pay | Admitting: Family Medicine

## 2022-06-14 ENCOUNTER — Telehealth (INDEPENDENT_AMBULATORY_CARE_PROVIDER_SITE_OTHER): Payer: 59 | Admitting: Family Medicine

## 2022-06-14 DIAGNOSIS — J0100 Acute maxillary sinusitis, unspecified: Secondary | ICD-10-CM | POA: Diagnosis not present

## 2022-06-14 MED ORDER — AMOXICILLIN-POT CLAVULANATE 875-125 MG PO TABS: 1.0000 | ORAL_TABLET | Freq: Two times a day (BID) | ORAL | 0 refills | Status: DC

## 2022-06-14 MED ORDER — BENZONATATE 200 MG PO CAPS: 200.0000 mg | ORAL_CAPSULE | Freq: Two times a day (BID) | ORAL | 0 refills | Status: DC | PRN

## 2022-06-14 NOTE — Progress Notes (Signed)
There were no vitals taken for this visit.   Subjective:    Patient ID: Danielle Pearson, female    DOB: 12-Mar-1986, 36 y.o.   MRN: [id number]  HPI: Danielle Pearson is a 36 y.o. female  Chief Complaint  Patient presents with   Sinusitis    Patient says she may have a sinus infection from being sick last week. Patient says she is extremely congested, sinus congestion, fatigue and headaches. Patient declines being seen. Patient has tried over the counter Sudafed and NyQuil. Patient says she feels like she is getting better, but just having lingering symptoms.    UPPER RESPIRATORY TRACT INFECTION Duration: 10 days  Worst symptom: headaches, congestion Fever: no Cough: yes Shortness of breath: no Wheezing: no Chest pain: no Chest tightness: no Chest congestion: no Nasal congestion: yes Runny nose: no Post nasal drip: yes Sneezing: no Sore throat: no Swollen glands: no Sinus pressure: yes Headache: yes Face pain: yes Toothache: yes Ear pain: no  Ear pressure: no bilateral Eyes red/itching:no Eye drainage/crusting: no  Vomiting: no Rash: no Fatigue: yes Sick contacts: no Strep contacts: no  Context: stable Recurrent sinusitis: no Relief with OTC cold/cough medications: no  Treatments attempted: sudafed, tylenol dayquil, nyquil   Relevant past medical, surgical, family and social history reviewed and updated as indicated. Interim medical history since our last visit reviewed. Allergies and medications reviewed and updated.  Review of Systems  Constitutional: Negative.   HENT:  Positive for congestion, postnasal drip, rhinorrhea, sinus pressure and sinus pain. Negative for dental problem, drooling, ear discharge, ear pain, facial swelling, hearing loss, mouth sores, nosebleeds, sneezing, sore throat, tinnitus, trouble swallowing and voice change.   Respiratory: Negative.    Cardiovascular: Negative.   Gastrointestinal: Negative.   Musculoskeletal: Negative.    Skin: Negative.   Psychiatric/Behavioral: Negative.      Per HPI unless specifically indicated above     Objective:    There were no vitals taken for this visit.  Wt Readings from Last 3 Encounters:  02/28/22 175 lb (79.4 kg)  04/16/21 157 lb 12.8 oz (71.6 kg)  10/11/20 166 lb (75.3 kg)    Physical Exam Vitals and nursing note reviewed.  Constitutional:      General: She is not in acute distress.    Appearance: Normal appearance. She is not ill-appearing, toxic-appearing or diaphoretic.  HENT:     Head: Normocephalic and atraumatic.     Right Ear: External ear normal.     Left Ear: External ear normal.     Nose: Nose normal.     Mouth/Throat:     Mouth: Mucous membranes are moist.     Pharynx: Oropharynx is clear.  Eyes:     General: No scleral icterus.       Right eye: No discharge.        Left eye: No discharge.     Conjunctiva/sclera: Conjunctivae normal.     Pupils: Pupils are equal, round, and reactive to light.  Pulmonary:     Effort: Pulmonary effort is normal. No respiratory distress.     Comments: Speaking in full sentences Musculoskeletal:        General: Normal range of motion.     Cervical back: Normal range of motion.  Skin:    Coloration: Skin is not jaundiced or pale.     Findings: No bruising, erythema, lesion or rash.  Neurological:     Mental Status: She is alert and oriented to person, place, and  time. Mental status is at baseline.  Psychiatric:        Mood and Affect: Mood normal.        Behavior: Behavior normal.        Thought Content: Thought content normal.        Judgment: Judgment normal.     Results for orders placed or performed in visit on 10/11/20  TSH  Result Value Ref Range   TSH 1.700 0.450 - 4.500 uIU/mL  Cytology - PAP  Result Value Ref Range   High risk HPV Negative    Adequacy      Satisfactory for evaluation; transformation zone component PRESENT.   Diagnosis      - Negative for intraepithelial lesion or malignancy  (NILM)   Comment Normal Reference Range HPV - Negative       Assessment & Plan:   Problem List Items Addressed This Visit   None Visit Diagnoses     Acute non-recurrent maxillary sinusitis    -  Primary   Will treat with augmentin and tessalon. Call if not getting better or getting worse. Continue to monitor.   Relevant Medications   amoxicillin-clavulanate (AUGMENTIN) 875-125 MG tablet   benzonatate (TESSALON) 200 MG capsule        Follow up plan: Return if symptoms worsen or fail to improve.   This visit was completed via video visit through MyChart due to the restrictions of the COVID-19 pandemic. All issues as above were discussed and addressed. Physical exam was done as above through visual confirmation on video through MyChart. If it was felt that the patient should be evaluated in the office, they were directed there. The patient verbally consented to this visit. Location of the patient: home Location of the provider: work Those involved with this call:  Provider: Olevia Perches, DO CMA: Malen Gauze, CMA Front Desk/Registration: [employer]  Time spent on call:  15 minutes with patient face to face via video conference. More than 50% of this time was spent in counseling and coordination of care. 23 minutes total spent in review of patient's record and preparation of their chart.

## 2022-07-30 ENCOUNTER — Ambulatory Visit (INDEPENDENT_AMBULATORY_CARE_PROVIDER_SITE_OTHER): Payer: 59 | Admitting: Nurse Practitioner

## 2022-07-30 ENCOUNTER — Other Ambulatory Visit: Payer: Self-pay | Admitting: Nurse Practitioner

## 2022-07-30 ENCOUNTER — Encounter: Payer: Self-pay | Admitting: Nurse Practitioner

## 2022-07-30 VITALS — BP 136/83 | HR 92 | Temp 98.6°F | Ht 65.0 in | Wt 178.5 lb

## 2022-07-30 DIAGNOSIS — N6342 Unspecified lump in left breast, subareolar: Secondary | ICD-10-CM | POA: Diagnosis not present

## 2022-07-30 NOTE — Progress Notes (Signed)
BP 136/83   Pulse 92   Temp 98.6 F (37 C) (Oral)   Ht 5' 5"$  (1.651 m)   Wt 178 lb 8 oz (81 kg)   LMP 07/26/2022   SpO2 96%   BMI 29.70 kg/m    Subjective:    Patient ID: Danielle Pearson, female    DOB: Apr 19, 1986, 37 y.o.   MRN: [id number]  HPI: Danielle Pearson is a 37 y.o. female  Chief Complaint  Patient presents with   Breast Mass    Patient states that she found a lump in left breast, noticed last Saturday   BREAST MASS Found breast mass to left side on Saturday.  No family history of breast cancer that she aware of.  No pain.  Denies any redness, reports bruised appearance -- no recent trauma to area.  She noticed area in the light of mirror and then assessed it. Duration :days Location: left Redness: no Swelling: no Trauma: no trauma Breastfeeding: no Associated with menstral cycle: no Nipple discharge: no Breast lump: yes Status: stable Treatments attempted: none Previous mammogram: no -- age under 9  Relevant past medical, surgical, family and social history reviewed and updated as indicated. Interim medical history since our last visit reviewed. Allergies and medications reviewed and updated.  Review of Systems  Constitutional:  Negative for activity change, appetite change, diaphoresis, fatigue and fever.  Respiratory:  Negative for cough, chest tightness and shortness of breath.   Cardiovascular:  Negative for chest pain, palpitations and leg swelling.  Gastrointestinal: Negative.   Neurological: Negative.   Psychiatric/Behavioral: Negative.      Per HPI unless specifically indicated above     Objective:    BP 136/83   Pulse 92   Temp 98.6 F (37 C) (Oral)   Ht 5' 5"$  (1.651 m)   Wt 178 lb 8 oz (81 kg)   LMP 07/26/2022   SpO2 96%   BMI 29.70 kg/m   Wt Readings from Last 3 Encounters:  07/30/22 178 lb 8 oz (81 kg)  02/28/22 175 lb (79.4 kg)  04/16/21 157 lb 12.8 oz (71.6 kg)    Physical Exam Vitals and nursing note reviewed.  Exam conducted with a chaperone present.  Constitutional:      General: She is awake. She is not in acute distress.    Appearance: She is well-developed and well-groomed. She is not ill-appearing or toxic-appearing.  HENT:     Head: Normocephalic.     Right Ear: Hearing normal.     Left Ear: Hearing normal.     Nose: Nose normal.     Mouth/Throat:     Mouth: Mucous membranes are moist.  Eyes:     General: Lids are normal.        Right eye: No discharge.        Left eye: No discharge.     Conjunctiva/sclera: Conjunctivae normal.     Pupils: Pupils are equal, round, and reactive to light.  Neck:     Thyroid: No thyromegaly.     Vascular: No carotid bruit or JVD.  Cardiovascular:     Rate and Rhythm: Normal rate and regular rhythm.     Heart sounds: Normal heart sounds. No murmur heard.    No gallop.  Pulmonary:     Effort: Pulmonary effort is normal.     Breath sounds: Normal breath sounds.  Chest:  Breasts:    Right: Normal.     Left: Mass present. No swelling, inverted  nipple, skin change or tenderness.    Abdominal:     General: Bowel sounds are normal.     Palpations: Abdomen is soft. There is no hepatomegaly or splenomegaly.  Musculoskeletal:     Cervical back: Normal range of motion and neck supple.     Right lower leg: No edema.     Left lower leg: No edema.  Lymphadenopathy:     Cervical: No cervical adenopathy.     Upper Body:     Right upper body: No supraclavicular, axillary or pectoral adenopathy.     Left upper body: No supraclavicular, axillary or pectoral adenopathy.  Skin:    General: Skin is warm and dry.  Neurological:     Mental Status: She is alert and oriented to person, place, and time.  Psychiatric:        Attention and Perception: Attention normal.        Mood and Affect: Mood normal.        Behavior: Behavior normal. Behavior is cooperative.        Thought Content: Thought content normal.        Judgment: Judgment normal.     Results  for orders placed or performed in visit on 10/11/20  TSH  Result Value Ref Range   TSH 1.700 0.450 - 4.500 uIU/mL  Cytology - PAP  Result Value Ref Range   High risk HPV Negative    Adequacy      Satisfactory for evaluation; transformation zone component PRESENT.   Diagnosis      - Negative for intraepithelial lesion or malignancy (NILM)   Comment Normal Reference Range HPV - Negative       Assessment & Plan:   Problem List Items Addressed This Visit       Other   Subareolar mass of left breast - Primary    Small cyst-like mass to under left nipple at approx 2 o'clock position with no breast changes.  Suspect more glandular vs cyst.  Will obtain imaging to further assess area. Determine next steps after imaging returns.      Relevant Orders   US BREAST LTD UNI LEFT INC AXILLA     Follow up plan: Return if symptoms worsen or fail to improve.

## 2022-07-30 NOTE — Patient Instructions (Signed)
Please call to schedule your mammogram and/or bone density: [facility]: [address], [place], [place] [address] Phone: [phone]  [place] [facility] [address]. [place],  [place]  [address] Phone: [phone]    Breast Scan A breast scan is an imaging test that is done to examine dense breast tissue. A breast scan is different from a mammogram. This scan is done using a radioactive material that produces images of the breast. A breast scan is used in people who have breast lesions that result from: Rope-like, lumpy tissue (fibrocystic disease). Solid, painless lumps (fibroadenoma). Damaged fatty breast tissue (fat necrosis). A breast scan may also be done to find out the best treatment for people who have breast cancer. Tell a health care provider about: Any allergies you have to medicines, contrast dyes, shellfish, or iodine. All medicines you are taking, including vitamins, herbs, eye drops, creams, and over-the-counter medicines. Any problems you or family members have had with anesthetic medicines. Any bleeding problems you have. Any surgeries you have had. Any medical conditions you have. Whether you are pregnant or may be pregnant. Whether you are breastfeeding. What are the risks? Your health care provider will talk with you about risks. These may include: Slight discomfort from the injection of a radioactive substance. Allergic reaction to the radioactive substance used during the test. What happens before the test? Ask your health care provider about changing or stopping your regular medicines. These include any diabetes medicines or blood thinners you take. What happens during the test?  You will be asked to remove all jewelry and clothing from the waist up. An IV will be inserted into one of your veins. You will be asked to lie face-down on a table. The breast that will be scanned  will be placed through an opening in the table. You may also be asked to get into different positions during the scan. The radioactive agent will be injected into the IV. You may have a slight metallic taste in your mouth after the injection. A scanner will be placed over the breast to record the radiation, which produces an image of the breast. The procedure may be repeated on the other breast. When the scan is complete, the IV will be removed. The procedure may vary among health care providers and hospitals. What can I expect after the test? You will be asked to get up slowly. This helps you avoid light-headedness after lying flat during the test. It is up to you to get the results of your procedure. Ask your health care provider, or the department that is doing the procedure, when your results will be ready. Follow these instructions at home: Drink enough fluid to keep your urine pale yellow. This helps to flush out the remaining radioactive substance from your body. Keep all follow-up visits. Your health care provider will talk with you about the results and if treatment is needed. This information is not intended to replace advice given to you by your health care provider. Make sure you discuss any questions you have with your health care provider. Document Revised: 11/21/2021 Document Reviewed: 11/21/2021 Elsevier Patient Education  [place].

## 2022-07-30 NOTE — Assessment & Plan Note (Signed)
Small cyst-like mass to under left nipple at approx 2 o'clock position with no breast changes.  Suspect more glandular vs cyst.  Will obtain imaging to further assess area. Determine next steps after imaging returns.

## 2022-08-01 ENCOUNTER — Ambulatory Visit
Admission: RE | Admit: 2022-08-01 | Discharge: 2022-08-01 | Disposition: A | Discharge status: Home / Self Care | Payer: 59 | Source: Ambulatory Visit | Attending: Nurse Practitioner | Admitting: Nurse Practitioner

## 2022-08-01 DIAGNOSIS — N6342 Unspecified lump in left breast, subareolar: Secondary | ICD-10-CM | POA: Diagnosis present

## 2022-08-01 NOTE — Progress Notes (Signed)
Contacted via [place] afternoon Danielle Pearson, looks like imaging returned noting a 6 mm sebaceous cyst to the area of concern.  Goods news!!  No cancerous findings.  For this is any changes or pain to area then we could consider in future sending to general surgery for removal.  For now I recommend monitoring only and mammograms starting at 40.  Any questions? Keep being awesome!!  Thank you for allowing me to participate in your care.  I appreciate you. Kindest regards, Danielle Pearson

## 2023-03-19 ENCOUNTER — Other Ambulatory Visit: Payer: Self-pay | Admitting: Obstetrics and Gynecology

## 2023-03-19 DIAGNOSIS — N920 Excessive and frequent menstruation with regular cycle: Secondary | ICD-10-CM

## 2023-03-19 DIAGNOSIS — Z3041 Encounter for surveillance of contraceptive pills: Secondary | ICD-10-CM

## 2023-03-31 ENCOUNTER — Other Ambulatory Visit: Payer: Self-pay

## 2023-03-31 DIAGNOSIS — Z3041 Encounter for surveillance of contraceptive pills: Secondary | ICD-10-CM

## 2023-03-31 DIAGNOSIS — N920 Excessive and frequent menstruation with regular cycle: Secondary | ICD-10-CM

## 2023-03-31 MED ORDER — DROSPIRENONE-ETHINYL ESTRADIOL 3-0.02 MG PO TABS: 1.0000 | ORAL_TABLET | Freq: Every day | ORAL | 0 refills | Status: DC

## 2023-05-01 ENCOUNTER — Ambulatory Visit: Payer: 59 | Admitting: Obstetrics and Gynecology

## 2023-05-27 NOTE — Progress Notes (Unsigned)
PCP:  [name], DO   No chief complaint on file.    HPI:      [name] is a 37 y.o. G1P0010 who LMP was No LMP recorded., presents today for her annual examination.  Her menses are regular every 28-30 days, lasting 2 days on OCPs, mod flow now, used to be 1-2 days light spotting.  Dysmenorrhea none.  She occas has intermenstrual bleeding. Pt was having emotional lability and increased cystic acne and started OCPs last yr with sx improvement.   Sex activity: single partner, contraception - OCPS, doing well. Did IUDs for many yrs, doesn't want another since last one was embedded and removal was painful.  Last Pap: 10/11/20 Results were: no abnormalities /neg HPV DNA   Hx of STDs: none  There is no FH of breast cancer. There is no FH of ovarian cancer. The patient does not do self-breast exams.  Tobacco use: quit Alcohol use: social No drug use.  Exercise: min active  She does get adequate calcium but not Vitamin D in her diet.   Past Medical History:  Diagnosis Date   Tobacco abuse     Past Surgical History:  Procedure Laterality Date   WISDOM TOOTH EXTRACTION      Family History  Problem Relation Age of Onset   Hypertension Father     Social History   Socioeconomic History   Marital status: Married    Spouse name: Not on file   Number of children: Not on file   Years of education: some colle   Highest education level: Not on file  Occupational History   Occupation: Bank Teller  Tobacco Use   Smoking status: Former    Types: Cigarettes   Smokeless tobacco: Never  Vaping Use   Vaping status: Every Day  Substance and Sexual Activity   Alcohol use: Yes    Alcohol/week: 0.0 standard drinks of alcohol    Comment: Socially   Drug use: No   Sexual activity: Yes    Birth control/protection: Pill  Other Topics Concern   Not on file  Social History Narrative   Not on file   Social Determinants of Health   Financial Resource Strain:  Not on file  Food Insecurity: Not on file  Transportation Needs: Not on file  Physical Activity: Not on file  Stress: Not on file  Social Connections: Not on file  Intimate Partner Violence: Not on file    No outpatient medications have been marked as taking for the 05/29/23 encounter (Appointment) with Tyjai Matuszak, Ilona Sorrel, PA-C.     ROS:  Review of Systems  Constitutional:  Negative for fatigue, fever and unexpected weight change.  Respiratory:  Negative for cough, shortness of breath and wheezing.   Cardiovascular:  Negative for chest pain, palpitations and leg swelling.  Gastrointestinal:  Negative for blood in stool, constipation, diarrhea, nausea and vomiting.  Endocrine: Negative for cold intolerance, heat intolerance and polyuria.  Genitourinary:  Negative for dyspareunia, dysuria, flank pain, frequency, genital sores, hematuria, menstrual problem, pelvic pain, urgency, vaginal bleeding, vaginal discharge and vaginal pain.  Musculoskeletal:  Negative for back pain, joint swelling and myalgias.  Skin:  Negative for rash.  Neurological:  Negative for dizziness, syncope, light-headedness, numbness and headaches.  Hematological:  Negative for adenopathy.  Psychiatric/Behavioral:  Negative for agitation, confusion, sleep disturbance and suicidal ideas. The patient is not nervous/anxious.      Objective: There were no vitals taken for this visit.  Physical Exam Constitutional:      Appearance: She is well-developed.  Genitourinary:     Vulva normal.     Right Labia: No rash, tenderness or lesions.    Left Labia: No tenderness, lesions or rash.    No vaginal discharge, erythema or tenderness.      Right Adnexa: not tender and no mass present.    Left Adnexa: not tender and no mass present.    No cervical motion tenderness, friability or polyp.     Uterus is not enlarged or tender.  Breasts:    Right: No mass, nipple discharge, skin change or tenderness.     Left: No  mass, nipple discharge, skin change or tenderness.  Neck:     Thyroid: No thyromegaly.  Cardiovascular:     Rate and Rhythm: Normal rate and regular rhythm.     Heart sounds: Normal heart sounds. No murmur heard. Pulmonary:     Effort: Pulmonary effort is normal.     Breath sounds: Normal breath sounds.  Abdominal:     Palpations: Abdomen is soft.     Tenderness: There is no abdominal tenderness. There is no guarding or rebound.  Musculoskeletal:        General: Normal range of motion.     Cervical back: Normal range of motion.  Lymphadenopathy:     Cervical: No cervical adenopathy.  Neurological:     General: No focal deficit present.     Mental Status: She is alert and oriented to person, place, and time.     Cranial Nerves: No cranial nerve deficit.  Skin:    General: Skin is warm and dry.  Psychiatric:        Mood and Affect: Mood normal.        Behavior: Behavior normal.        Thought Content: Thought content normal.        Judgment: Judgment normal.  Vitals reviewed.     Assessment/Plan: Encounter for annual routine gynecological examination  Encounter for surveillance of contraceptive pills - Plan: drospirenone-ethinyl estradiol (NIKKI) 3-0.02 MG tablet; OCP RF  Menorrhagia with regular cycle - Plan: drospirenone-ethinyl estradiol (NIKKI) 3-0.02 MG tablet; improved with OCPs, f/u prn   No orders of the defined types were placed in this encounter.    GYN counsel adequate intake of calcium and vitamin D, diet and exercise     F/U  No follow-ups on file.  Pocahontas Cohenour B. Berkley Cronkright, PA-C 05/27/2023 3:32 PM

## 2023-05-29 ENCOUNTER — Encounter: Payer: Self-pay | Admitting: Obstetrics and Gynecology

## 2023-05-29 ENCOUNTER — Ambulatory Visit (INDEPENDENT_AMBULATORY_CARE_PROVIDER_SITE_OTHER): Payer: 59 | Admitting: Obstetrics and Gynecology

## 2023-05-29 VITALS — BP 111/63 | HR 84 | Ht 65.0 in | Wt 186.0 lb

## 2023-05-29 DIAGNOSIS — N920 Excessive and frequent menstruation with regular cycle: Secondary | ICD-10-CM

## 2023-05-29 DIAGNOSIS — Z01419 Encounter for gynecological examination (general) (routine) without abnormal findings: Secondary | ICD-10-CM | POA: Diagnosis not present

## 2023-05-29 DIAGNOSIS — Z3041 Encounter for surveillance of contraceptive pills: Secondary | ICD-10-CM

## 2023-05-29 MED ORDER — DROSPIRENONE-ETHINYL ESTRADIOL 3-0.02 MG PO TABS: 1.0000 | ORAL_TABLET | Freq: Every day | ORAL | 3 refills | Status: DC

## 2023-05-29 NOTE — Patient Instructions (Signed)
I value your feedback and you entrusting us with your care. If you get a [facility] patient survey, I would appreciate you taking the time to let us know about your experience today. Thank you! ? ? ?

## 2024-01-28 ENCOUNTER — Ambulatory Visit (INDEPENDENT_AMBULATORY_CARE_PROVIDER_SITE_OTHER): Admitting: Family Medicine

## 2024-01-28 ENCOUNTER — Encounter: Payer: Self-pay | Admitting: Family Medicine

## 2024-01-28 VITALS — BP 131/87 | HR 85 | Temp 97.9°F | Ht 65.0 in | Wt 164.4 lb

## 2024-01-28 DIAGNOSIS — R21 Rash and other nonspecific skin eruption: Secondary | ICD-10-CM

## 2024-01-28 DIAGNOSIS — Z789 Other specified health status: Secondary | ICD-10-CM | POA: Diagnosis not present

## 2024-01-28 DIAGNOSIS — Z13220 Encounter for screening for lipoid disorders: Secondary | ICD-10-CM

## 2024-01-28 LAB — BAYER DCA HB A1C WAIVED: HB A1C (BAYER DCA - WAIVED): 4.9 % (ref 4.8–5.6)

## 2024-01-28 MED ORDER — TERBINAFINE HCL 1 % EX CREA: 1.0000 | TOPICAL_CREAM | Freq: Two times a day (BID) | CUTANEOUS | 6 refills | Status: DC

## 2024-01-28 MED ORDER — BUSPIRONE HCL 5 MG PO TABS: 5.0000 mg | ORAL_TABLET | Freq: Three times a day (TID) | ORAL | 2 refills | Status: AC | PRN

## 2024-01-28 NOTE — Progress Notes (Unsigned)
 BP 131/87   Pulse 85   Temp 97.9 F (36.6 C) (Oral)   Ht 5' 5 (1.651 m)   Wt 164 lb 6.4 oz (74.6 kg)   SpO2 99%   BMI 27.36 kg/m    Subjective:    Patient ID: Danielle Pearson, female    DOB: 1985-11-05, 38 y.o.   MRN: [id number]  HPI: Danielle Pearson is a 38 y.o. female  Chief Complaint  Patient presents with   Tinea    Ongoing issue. Goes away and comes back. 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    RASH- biopsy proven, has done oral lamisil  and it didn't totally go away, has been worse around her period when she gets really hot and not feeling like herself Duration:  years  Location: trunk  Itching: yes Burning: yes Redness: yes Oozing: no Scaling: yes Blisters: no Painful: no Fevers: no Change in detergents/soaps/personal care products: no Recent illness: no Recent travel:no History of same: yes Context: better Alleviating factors: oral lamisil , topical lamisil  Treatments attempted:oral lamisil , topical lamisil  Shortness of breath: no  Throat/tongue swelling: no Myalgias/arthralgias: no  Relevant past medical, surgical, family and social history reviewed and updated as indicated. Interim medical history since our last visit reviewed. Allergies and medications reviewed and updated.  Review of Systems  Constitutional: Negative.   Respiratory: Negative.    Cardiovascular: Negative.   Musculoskeletal: Negative.   Skin:  Positive for rash. Negative for color change, pallor and wound.  Neurological: Negative.   Psychiatric/Behavioral: Negative.      Per HPI unless specifically indicated above     Objective:    BP 131/87   Pulse 85   Temp 97.9 F (36.6 C) (Oral)   Ht 5' 5 (1.651 m)   Wt 164 lb 6.4 oz (74.6 kg)   SpO2 99%   BMI 27.36 kg/m   Wt Readings from Last 3 Encounters:  01/28/24 164 lb 6.4 oz (74.6 kg)  05/29/23 186 lb (84.4 kg)  07/30/22 178 lb 8 oz (81 kg)     Physical Exam Vitals and nursing note reviewed.  Constitutional:      General: She is not in acute distress.    Appearance: Normal appearance. She is not ill-appearing, toxic-appearing or diaphoretic.  HENT:     Head: Normocephalic and atraumatic.     Right Ear: External ear normal.     Left Ear: External ear normal.     Nose: Nose normal.     Mouth/Throat:     Mouth: Mucous membranes are moist.     Pharynx: Oropharynx is clear.  Eyes:     General: No scleral icterus.       Right eye: No discharge.        Left eye: No discharge.     Extraocular Movements: Extraocular movements intact.     Conjunctiva/sclera: Conjunctivae normal.     Pupils: Pupils are equal, round, and reactive to light.  Cardiovascular:     Rate and Rhythm: Normal rate and regular rhythm.     Pulses: Normal pulses.     Heart sounds: Normal heart sounds. No murmur heard.    No friction rub. No gallop.  Pulmonary:     Effort: Pulmonary effort is normal. No respiratory distress.     Breath sounds: Normal breath sounds. No stridor. No wheezing, rhonchi or rales.  Chest:     Chest wall: No  BP 131/87   Pulse 85   Temp 97.9 F (36.6 C) (Oral)   Ht 5' 5 (1.651 m)   Wt 164 lb 6.4 oz (74.6 kg)   BMI 27.36 kg/m    Subjective:    Patient ID: Danielle Pearson, female    DOB: [birth year], 38 y.o.   MRN: [id number]  HPI: Danielle Pearson is a 38 y.o. female  Chief Complaint  Patient presents with   Tinea    Ongoing issue. Goes away and comes back. 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    RASH- biopsy proven, has done oral lamisil  and it didn't totally go away, has been worse around her period when she gets really hot and not feeling like herself Duration:  years  Location: trunk  Itching: yes Burning: yes Redness: yes Oozing: no Scaling: yes Blisters: no Painful: no Fevers: no Change in detergents/soaps/personal care products: no Recent illness: no Recent travel:no History of same: yes Context: better Alleviating factors: oral lamisil , topical lamisil  Treatments attempted:oral lamisil , topical lamisil  Shortness of breath: no  Throat/tongue swelling: no Myalgias/arthralgias: no  Relevant past medical, surgical, family and social history reviewed and updated as indicated. Interim medical history since our last visit reviewed. Allergies and medications reviewed and updated.  Review of Systems  Per HPI unless specifically indicated above     Objective:    BP 131/87   Pulse 85   Temp 97.9 F (36.6 C) (Oral)   Ht 5' 5 (1.651 m)   Wt 164 lb 6.4 oz (74.6 kg)   BMI 27.36 kg/m   Wt Readings from Last 3 Encounters:  01/28/24 164 lb 6.4 oz (74.6 kg)  05/29/23 186 lb (84.4 kg)  07/30/22 178 lb 8 oz (81 kg)    Physical Exam  Results for orders placed or performed in visit on 10/11/20  Cytology - PAP   Collection Time: 10/11/20  1:37 PM  Result Value Ref Range   High risk HPV Negative    Adequacy      Satisfactory for evaluation; transformation zone component PRESENT.   Diagnosis       - Negative for intraepithelial lesion or malignancy (NILM)   Comment Normal Reference Range HPV - Negative   TSH   Collection Time: 10/11/20  2:46 PM  Result Value Ref Range   TSH 1.700 0.450 - 4.500 uIU/mL      Assessment & Plan:   Problem List Items Addressed This Visit   None    Follow up plan: No follow-ups on file.  LDL Chol Calc (NIH) 92 0 - 99 mg/dL  TSH   Collection Time: 01/28/24  2:06 PM  Result Value Ref Range   TSH 2.110 0.450 - 4.500 uIU/mL  VITAMIN D  25 Hydroxy (Vit-D Deficiency, Fractures)   Collection  Time: 01/28/24  2:06 PM  Result Value Ref Range   Vit D, 25-Hydroxy 28.0 (L) 30.0 - 100.0 ng/mL  Gliadin antibodies, serum   Collection Time: 01/28/24  2:06 PM  Result Value Ref Range   Antigliadin Abs, IgA 2 0 - 19 units   Gliadin IgG 2 0 - 19 units  Tissue transglutaminase, IgA   Collection Time: 01/28/24  2:06 PM  Result Value Ref Range   Transglutaminase IgA <2 0 - 3 U/mL  Reticulin Antibody, IgA w reflex titer   Collection Time: 01/28/24  2:06 PM  Result Value Ref Range   Reticulin Ab, IgA WILL FOLLOW   HIV Antibody (routine testing w rflx)   Collection Time: 01/28/24  2:06 PM  Result Value Ref Range   HIV Screen 4th Generation wRfx Non Reactive Non Reactive  Hepatitis B surface antibody,quantitative   Collection Time: 01/28/24  2:06 PM  Result Value Ref Range   Hepatitis B Surf Ab Quant 1,323.0 Immunity>10 mIU/mL  RA Qn+CCP(IgG/A)+SjoSSA+SjoSSB   Collection Time: 01/28/24  2:06 PM  Result Value Ref Range   Rheumatoid fact SerPl-aCnc <10.0 <14.0 IU/mL   ENA SSA (RO) Ab 0.4 0.0 - 0.9 AI   ENA SSB (LA) Ab <0.2 0.0 - 0.9 AI   Cyclic Citrullin Peptide Ab 5 0 - 19 units  Sed Rate (ESR)   Collection Time: 01/28/24  2:06 PM  Result Value Ref Range   Sed Rate 5 0 - 32 mm/hr  CK (Creatine Kinase)   Collection Time: 01/28/24  2:06 PM  Result Value Ref Range   Total CK 45 32 - 182 U/L  ANA 12 Plus Profile (RDL)   Collection Time: 01/28/24  2:06 PM  Result Value Ref Range   Anti-Nuclear Ab by IFA (RDL) Positive (A) Negative  ANA 12 Plus Profile, Positive   Collection Time: 01/28/24  2:06 PM  Result Value Ref Range   Speckled Pattern 1:160 (H) <1:40   Note: Comment    Anti-Centromere Ab (RDL) <1:40 <1:40   Anti-dsDNA Ab by Farr(RDL) WILL FOLLOW    Anti-Sm Ab (RDL) WILL FOLLOW    Anti-U1 RNP Ab (RDL) WILL FOLLOW    Anti-Ro (SS-A) Ab (RDL) WILL FOLLOW    Anti-La (SS-B) Ab (RDL) WILL FOLLOW    Anti-Scl-70 Ab (RDL) WILL FOLLOW    Anti-Cardiolipin Ab, IgG (RDL) WILL FOLLOW     Anti-Cardiolipin Ab, IgA (RDL) WILL FOLLOW    Anti-Cardiolipin Ab, IgM (RDL) WILL FOLLOW    C3 Complement (RDL) WILL FOLLOW    C4 Complement (RDL) WILL FOLLOW    Anti-TPO Ab (RDL) WILL FOLLOW    Anti-Chromatin Ab, IgG (RDL) WILL FOLLOW    Anti-CCP Ab, IgG & IgA (RDL) WILL FOLLOW    Rheumatoid Factor by Turb RDL CANCELED IU/mL   ANA Plus 12 Interpretation Comment       Assessment & Plan:   Problem List Items Addressed This Visit       Musculoskeletal and Integument   Rash - Primary   Chronic and not resolving despite several dermatology visits. We will check labs and treat with terbinafine . Will check labs to look for immune issues that may be contributing. Await results. Treat as needed. Call with any concerns.  Relevant Orders   CBC with Differential/Platelet (Completed)   Comprehensive metabolic panel with GFR (Completed)   TSH (Completed)   Bayer DCA Hb A1c Waived (Completed)   VITAMIN D  25 Hydroxy (Vit-D Deficiency, Fractures) (Completed)   Gliadin antibodies, serum (Completed)   Tissue transglutaminase, IgA (Completed)   Reticulin Antibody, IgA w reflex titer (Completed)   HIV Antibody (routine testing w rflx) (Completed)   RA Qn+CCP(IgG/A)+SjoSSA+SjoSSB (Completed)   Sed Rate (ESR) (Completed)   CK (Creatine Kinase) (Completed)   ANA 12 Plus Profile (RDL) (Completed)   Other Visit Diagnoses       Hepatitis B vaccination status unknown       Labs drawn today. Await results. Treat as needed.   Relevant Orders   Hepatitis B surface antibody,quantitative (Completed)     Screening for cholesterol level       Labs drawn today. Await results. Treat as needed.   Relevant Orders   Lipid Panel w/o Chol/HDL Ratio (Completed)        Follow up plan: Return in about 4 weeks (around 02/25/2024) for physical and follow up rash, OK to use same day.

## 2024-02-03 DIAGNOSIS — B354 Tinea corporis: Secondary | ICD-10-CM | POA: Insufficient documentation

## 2024-02-03 DIAGNOSIS — R21 Rash and other nonspecific skin eruption: Secondary | ICD-10-CM | POA: Insufficient documentation

## 2024-02-03 MED ORDER — TERBINAFINE HCL 250 MG PO TABS: 250.0000 mg | ORAL_TABLET | Freq: Every day | ORAL | 0 refills | Status: DC

## 2024-02-03 NOTE — Assessment & Plan Note (Signed)
 Chronic and not resolving despite several dermatology visits. We will check labs and treat with terbinafine . Will check labs to look for immune issues that may be contributing. Await results. Treat as needed. Call with any concerns.

## 2024-02-06 LAB — CBC WITH DIFFERENTIAL/PLATELET
Basophils Absolute: 0.1 x10E3/uL (ref 0.0–0.2)
Basos: 1 %
EOS (ABSOLUTE): 0 x10E3/uL (ref 0.0–0.4)
Eos: 0 %
Hematocrit: 40.2 % (ref 34.0–46.6)
Hemoglobin: 13.5 g/dL (ref 11.1–15.9)
Immature Grans (Abs): 0 x10E3/uL (ref 0.0–0.1)
Immature Granulocytes: 0 %
Lymphocytes Absolute: 2.7 x10E3/uL (ref 0.7–3.1)
Lymphs: 34 %
MCH: 33.1 pg — ABNORMAL HIGH (ref 26.6–33.0)
MCHC: 33.6 g/dL (ref 31.5–35.7)
MCV: 99 fL — ABNORMAL HIGH (ref 79–97)
Monocytes Absolute: 0.5 x10E3/uL (ref 0.1–0.9)
Monocytes: 6 %
Neutrophils Absolute: 4.7 x10E3/uL (ref 1.4–7.0)
Neutrophils: 59 %
Platelets: 369 x10E3/uL (ref 150–450)
RBC: 4.08 x10E6/uL (ref 3.77–5.28)
RDW: 12.2 % (ref 11.7–15.4)
WBC: 8 x10E3/uL (ref 3.4–10.8)

## 2024-02-06 LAB — ANA 12 PLUS PROFILE, POSITIVE
Anti-CCP Ab, IgG & IgA (RDL): <20 U (ref <20)
Anti-Cardiolipin Ab, IgA (RDL): <12 U/mL (ref <12)
Anti-Cardiolipin Ab, IgG (RDL): <15 GPL U/mL (ref <15)
Anti-Cardiolipin Ab, IgM (RDL): <13 [MPL'U]/mL (ref <13)
Anti-Centromere Ab (RDL): <1:40 {titer}
Anti-Chromatin Ab, IgG (RDL): <20 U (ref <20)
Anti-La (SS-B) Ab (RDL): <20 U (ref <20)
Anti-Ro (SS-A) Ab (RDL): <20 U (ref <20)
Anti-Scl-70 Ab (RDL): <20 U (ref <20)
Anti-Sm Ab (RDL): <20 U (ref <20)
Anti-TPO Ab (RDL): 532 [IU]/mL — AB (ref <9.0)
Anti-U1 RNP Ab (RDL): <20 U (ref <20)
Anti-dsDNA Ab by Farr(RDL): <8 [IU]/mL (ref <8.0)
C3 Complement (RDL): 159 mg/dL (ref 90–180)
C4 Complement (RDL): 25 mg/dL (ref 10–40)
Speckled Pattern: 1:160 {titer} — ABNORMAL HIGH

## 2024-02-06 LAB — COMPREHENSIVE METABOLIC PANEL WITH GFR
ALT: 23 IU/L (ref 0–32)
AST: 20 IU/L (ref 0–40)
Albumin: 4.9 g/dL (ref 3.9–4.9)
Alkaline Phosphatase: 45 IU/L (ref 44–121)
BUN/Creatinine Ratio: 13 (ref 9–23)
BUN: 8 mg/dL (ref 6–20)
Bilirubin Total: 1 mg/dL (ref 0.0–1.2)
CO2: 20 mmol/L (ref 20–29)
Calcium: 9.4 mg/dL (ref 8.7–10.2)
Chloride: 99 mmol/L (ref 96–106)
Creatinine, Ser: 0.64 mg/dL (ref 0.57–1.00)
Globulin, Total: 2.1 g/dL (ref 1.5–4.5)
Glucose: 83 mg/dL (ref 70–99)
Potassium: 4.1 mmol/L (ref 3.5–5.2)
Sodium: 135 mmol/L (ref 134–144)
Total Protein: 7 g/dL (ref 6.0–8.5)
eGFR: 116 mL/min/1.73 (ref >59)

## 2024-02-06 LAB — LIPID PANEL W/O CHOL/HDL RATIO
Cholesterol, Total: 183 mg/dL (ref 100–199)
HDL: 65 mg/dL (ref >39)
LDL Chol Calc (NIH): 92 mg/dL (ref 0–99)
Triglycerides: 153 mg/dL — ABNORMAL HIGH (ref 0–149)
VLDL Cholesterol Cal: 26 mg/dL (ref 5–40)

## 2024-02-06 LAB — HEPATITIS B SURFACE ANTIBODY, QUANTITATIVE: Hepatitis B Surf Ab Quant: 1323 m[IU]/mL

## 2024-02-06 LAB — CK: Total CK: 45 U/L (ref 32–182)

## 2024-02-06 LAB — VITAMIN D 25 HYDROXY (VIT D DEFICIENCY, FRACTURES): Vit D, 25-Hydroxy: 28 ng/mL — ABNORMAL LOW (ref 30.0–100.0)

## 2024-02-06 LAB — RETICULIN ANTIBODIES, IGA W TITER

## 2024-02-06 LAB — RA QN+CCP(IGG/A)+SJOSSA+SJOSSB
Cyclic Citrullin Peptide Ab: 5 U (ref 0–19)
ENA SSA (RO) Ab: 0.4 AI (ref 0.0–0.9)
ENA SSB (LA) Ab: <0.2 AI (ref 0.0–0.9)
Rheumatoid fact SerPl-aCnc: <10 [IU]/mL (ref <14.0)

## 2024-02-06 LAB — ANA 12 PLUS PROFILE (RDL): Anti-Nuclear Ab by IFA (RDL): POSITIVE — AB

## 2024-02-06 LAB — SEDIMENTATION RATE: Sed Rate: 5 mm/h (ref 0–32)

## 2024-02-06 LAB — HIV ANTIBODY (ROUTINE TESTING W REFLEX): HIV Screen 4th Generation wRfx: NONREACTIVE

## 2024-02-06 LAB — GLIADIN ANTIBODIES, SERUM
Antigliadin Abs, IgA: 2 U (ref 0–19)
Gliadin IgG: 2 U (ref 0–19)

## 2024-02-06 LAB — TSH: TSH: 2.11 u[IU]/mL (ref 0.450–4.500)

## 2024-02-06 LAB — TISSUE TRANSGLUTAMINASE, IGA: Transglutaminase IgA: <2 U/mL (ref 0–3)

## 2024-02-12 ENCOUNTER — Ambulatory Visit: Payer: Self-pay | Admitting: Family Medicine

## 2024-02-12 DIAGNOSIS — E063 Autoimmune thyroiditis: Secondary | ICD-10-CM | POA: Insufficient documentation

## 2024-02-17 ENCOUNTER — Encounter: Payer: Self-pay | Admitting: Family Medicine

## 2024-02-20 ENCOUNTER — Other Ambulatory Visit: Payer: Self-pay | Admitting: Family Medicine

## 2024-02-20 NOTE — Telephone Encounter (Signed)
 Too soon for refill.  Requested Prescriptions  Pending Prescriptions Disp Refills   busPIRone  (BUSPAR ) 5 MG tablet [Pharmacy Med Name: BUSPIRONE  HCL 5 MG TABLET] 270 tablet 1    Sig: TAKE 1 TABLET BY MOUTH 3 TIMES DAILY AS NEEDED.     Psychiatry: Anxiolytics/Hypnotics - Non-controlled Failed - 02/20/2024  4:19 PM      Failed - Valid encounter within last 12 months    Recent Outpatient Visits           3 weeks ago Rash   [facility] [place], Carlton, DO

## 2024-03-04 ENCOUNTER — Other Ambulatory Visit: Payer: Self-pay | Admitting: Family Medicine

## 2024-03-04 NOTE — Telephone Encounter (Signed)
 Copied from CRM [id number]. Topic: Clinical - Medication Refill >> Mar 04, 2024  9:28 AM Berwyn MATSU wrote: Medication:  terbinafine  (LAMISIL ) 250 MG tablet; terbinafine  (LAMISIL ) 1 % cream   Has the patient contacted their pharmacy? Yes (Agent: If no, request that the patient contact the pharmacy for the refill. If patient does not wish to contact the pharmacy document the reason why and proceed with request.) (Agent: If yes, when and what did the pharmacy advise?)  This is the patient's preferred pharmacy:  [id number] [address] [facility] - [address] [place] [address] Phone: [phone] Fax: [phone]  Is this the correct pharmacy for this prescription? Yes If no, delete pharmacy and type the correct one.   Has the prescription been filled recently? Yes  Is the patient out of the medication? Yes  Has the patient been seen for an appointment in the last year OR does the patient have an upcoming appointment? Yes  Can we respond through MyChart? Yes  Agent: Please be advised that Rx refills may take up to 3 business days. We ask that you follow-up with your pharmacy.

## 2024-03-05 NOTE — Telephone Encounter (Signed)
 Requested medication (s) are due for refill today: yes  Requested medication (s) are on the active medication list: yes  Last refill:  02/03/24  Future visit scheduled: no  Notes to clinic:  Medication not assigned to a protocol, review manually.      Requested Prescriptions  Pending Prescriptions Disp Refills   terbinafine  (LAMISIL ) 1 % cream 30 g 6    Sig: Apply 1 Application topically 2 (two) times daily.     Off-Protocol Failed - 03/05/2024  8:12 AM      Failed - Medication not assigned to a protocol, review manually.      Failed - Valid encounter within last 12 months    Recent Outpatient Visits           1 month ago Rash   [place] [facility] [place], [place] P, DO               terbinafine  (LAMISIL ) 250 MG tablet 30 tablet 0    Sig: Take 1 tablet (250 mg total) by mouth daily.     Off-Protocol Failed - 03/05/2024  8:12 AM      Failed - Medication not assigned to a protocol, review manually.      Failed - Valid encounter within last 12 months    Recent Outpatient Visits           1 month ago Rash   [place] [facility] [place], [place], [place]

## 2024-03-09 ENCOUNTER — Other Ambulatory Visit: Payer: Self-pay

## 2024-03-09 ENCOUNTER — Telehealth: Payer: Self-pay | Admitting: Family Medicine

## 2024-03-09 NOTE — Telephone Encounter (Signed)
 Copied from CRM #[id number]. Topic: Clinical - Prescription Issue >> Mar 09, 2024 10:27 AM Tiffini S wrote: Reason for CRM: Patient called stating that she is out of medication for terbinafine  (LAMISIL ) 250 MG tablet; terbinafine  (LAMISIL ) 1 % cream/ have concerns that the illness with flair back up again- called [facility], pcp haven't looked at request from 03/05/24 yet.  Please call patient and update at [phone]. Patient asked to talk directly with clinic.

## 2024-03-11 ENCOUNTER — Telehealth: Payer: Self-pay | Admitting: Family Medicine

## 2024-03-11 MED ORDER — TERBINAFINE HCL 250 MG PO TABS: 250.0000 mg | ORAL_TABLET | Freq: Every day | ORAL | 0 refills | Status: DC

## 2024-03-11 MED ORDER — TERBINAFINE HCL 1 % EX CREA: 1.0000 | TOPICAL_CREAM | Freq: Two times a day (BID) | CUTANEOUS | 6 refills | Status: AC

## 2024-03-11 NOTE — Telephone Encounter (Signed)
 Called patient and left a message for her to call back to get scheduled.

## 2024-03-11 NOTE — Telephone Encounter (Signed)
 Over due for follow up

## 2024-03-11 NOTE — Telephone Encounter (Signed)
 I'd like to see her sooner to check labs and see how the pills are working for her

## 2024-03-11 NOTE — Telephone Encounter (Signed)
 Called patient and left a message for her to call back to get scheduled. Please transfer call to office. Dr Vicci would like to see her sooner.

## 2024-04-02 ENCOUNTER — Encounter: Payer: Self-pay | Admitting: Family Medicine

## 2024-04-02 ENCOUNTER — Ambulatory Visit (INDEPENDENT_AMBULATORY_CARE_PROVIDER_SITE_OTHER): Admitting: Family Medicine

## 2024-04-02 VITALS — BP 132/90 | HR 79 | Ht 65.0 in | Wt 155.2 lb

## 2024-04-02 DIAGNOSIS — B354 Tinea corporis: Secondary | ICD-10-CM | POA: Diagnosis not present

## 2024-04-02 NOTE — Assessment & Plan Note (Signed)
 Doing well with the oral lamisil . Rechecking labs today- if liver function is normal will refill x 3 months and follow up in 3 months. Call with any concerns. Plan to keep her on oral lamisil  for 6 months to a year to ensure erradication.

## 2024-04-02 NOTE — Progress Notes (Signed)
 BP (!) 132/90   Pulse 79   Ht 5' 5 (1.651 m)   Wt 155 lb 3.2 oz (70.4 kg)   SpO2 98%   BMI 25.83 kg/m    Subjective:    Patient ID: Danielle Pearson, female    DOB: 01-Jan-1986, 38 y.o.   MRN: [id number]  HPI: Danielle Pearson is a 38 y.o. female  Chief Complaint  Patient presents with   Tinea   RASH- doing significantly better on the lamisil , but as soon as she stopped it the rash came back. No side effects. Feeling well. Location: trunk  Itching: yes Burning: yes Redness: yes Oozing: no Scaling: yes Blisters: no Painful: no Fevers: no Change in detergents/soaps/personal care products: no Recent illness: no Recent travel:no History of same: yes Context: better Alleviating factors: oral lamisil , topical lamisil  Treatments attempted:oral lamisil , topical lamisil  Shortness of breath: no  Throat/tongue swelling: no Myalgias/arthralgias: no  Relevant past medical, surgical, family and social history reviewed and updated as indicated. Interim medical history since our last visit reviewed. Allergies and medications reviewed and updated.  Review of Systems  Constitutional: Negative.   Respiratory: Negative.    Cardiovascular: Negative.   Musculoskeletal: Negative.   Skin: Negative.   Neurological: Negative.   Psychiatric/Behavioral: Negative.      Per HPI unless specifically indicated above     Objective:    BP (!) 132/90   Pulse 79   Ht 5' 5 (1.651 m)   Wt 155 lb 3.2 oz (70.4 kg)   SpO2 98%   BMI 25.83 kg/m   Wt Readings from Last 3 Encounters:  04/02/24 155 lb 3.2 oz (70.4 kg)  01/28/24 164 lb 6.4 oz (74.6 kg)  05/29/23 186 lb (84.4 kg)    Physical Exam Vitals and nursing note reviewed.  Constitutional:      General: She is not in acute distress.    Appearance: Normal appearance. She is not ill-appearing, toxic-appearing or diaphoretic.  HENT:     Head: Normocephalic and atraumatic.     Right Ear: External ear normal.     Left Ear:  External ear normal.     Nose: Nose normal.     Mouth/Throat:     Mouth: Mucous membranes are moist.     Pharynx: Oropharynx is clear.  Eyes:     General: No scleral icterus.       Right eye: No discharge.        Left eye: No discharge.     Extraocular Movements: Extraocular movements intact.     Conjunctiva/sclera: Conjunctivae normal.     Pupils: Pupils are equal, round, and reactive to light.  Cardiovascular:     Rate and Rhythm: Normal rate and regular rhythm.     Pulses: Normal pulses.     Heart sounds: Normal heart sounds. No murmur heard.    No friction rub. No gallop.  Pulmonary:     Effort: Pulmonary effort is normal. No respiratory distress.     Breath sounds: Normal breath sounds. No stridor. No wheezing, rhonchi or rales.  Chest:     Chest wall: No tenderness.  Musculoskeletal:        General: Normal range of motion.     Cervical back: Normal range of motion and neck supple.  Skin:    General: Skin is warm and dry.     Capillary Refill: Capillary refill takes less than 2 seconds.     Coloration: Skin is not jaundiced or pale.  Findings: No bruising, erythema, lesion or rash.  Neurological:     General: No focal deficit present.     Mental Status: She is alert and oriented to person, place, and time. Mental status is at baseline.  Psychiatric:        Mood and Affect: Mood normal.        Behavior: Behavior normal.        Thought Content: Thought content normal.        Judgment: Judgment normal.     Results for orders placed or performed in visit on 01/28/24  Bayer DCA Hb A1c Waived   Collection Time: 01/28/24  2:03 PM  Result Value Ref Range   HB A1C (BAYER DCA - WAIVED) 4.9 4.8 - 5.6 %  CBC with Differential/Platelet   Collection Time: 01/28/24  2:06 PM  Result Value Ref Range   WBC 8.0 3.4 - 10.8 x10E3/uL   RBC 4.08 3.77 - 5.28 x10E6/uL   Hemoglobin 13.5 11.1 - 15.9 g/dL   Hematocrit 59.7 65.9 - 46.6 %   MCV 99 (H) 79 - 97 fL   MCH 33.1 (H) 26.6 -  33.0 pg   MCHC 33.6 31.5 - 35.7 g/dL   RDW 87.7 88.2 - 84.5 %   Platelets 369 150 - 450 x10E3/uL   Neutrophils 59 Not Estab. %   Lymphs 34 Not Estab. %   Monocytes 6 Not Estab. %   Eos 0 Not Estab. %   Basos 1 Not Estab. %   Neutrophils Absolute 4.7 1.4 - 7.0 x10E3/uL   Lymphocytes Absolute 2.7 0.7 - 3.1 x10E3/uL   Monocytes Absolute 0.5 0.1 - 0.9 x10E3/uL   EOS (ABSOLUTE) 0.0 0.0 - 0.4 x10E3/uL   Basophils Absolute 0.1 0.0 - 0.2 x10E3/uL   Immature Granulocytes 0 Not Estab. %   Immature Grans (Abs) 0.0 0.0 - 0.1 x10E3/uL  Comprehensive metabolic panel with GFR   Collection Time: 01/28/24  2:06 PM  Result Value Ref Range   Glucose 83 70 - 99 mg/dL   BUN 8 6 - 20 mg/dL   Creatinine, Ser 9.35 0.57 - 1.00 mg/dL   eGFR 883 >40 fO/fpw/8.26   BUN/Creatinine Ratio 13 9 - 23   Sodium 135 134 - 144 mmol/L   Potassium 4.1 3.5 - 5.2 mmol/L   Chloride 99 96 - 106 mmol/L   CO2 20 20 - 29 mmol/L   Calcium 9.4 8.7 - 10.2 mg/dL   Total Protein 7.0 6.0 - 8.5 g/dL   Albumin 4.9 3.9 - 4.9 g/dL   Globulin, Total 2.1 1.5 - 4.5 g/dL   Bilirubin Total 1.0 0.0 - 1.2 mg/dL   Alkaline Phosphatase 45 44 - 121 IU/L   AST 20 0 - 40 IU/L   ALT 23 0 - 32 IU/L  Lipid Panel w/o Chol/HDL Ratio   Collection Time: 01/28/24  2:06 PM  Result Value Ref Range   Cholesterol, Total 183 100 - 199 mg/dL   Triglycerides 846 (H) 0 - 149 mg/dL   HDL 65 >60 mg/dL   VLDL Cholesterol Cal 26 5 - 40 mg/dL   LDL Chol Calc (NIH) 92 0 - 99 mg/dL  TSH   Collection Time: 01/28/24  2:06 PM  Result Value Ref Range   TSH 2.110 0.450 - 4.500 uIU/mL  VITAMIN D  25 Hydroxy (Vit-D Deficiency, Fractures)   Collection Time: 01/28/24  2:06 PM  Result Value Ref Range   Vit D, 25-Hydroxy 28.0 (L) 30.0 - 100.0 ng/mL  Gliadin  antibodies, serum   Collection Time: 01/28/24  2:06 PM  Result Value Ref Range   Antigliadin Abs, IgA 2 0 - 19 units   Gliadin IgG 2 0 - 19 units  Tissue transglutaminase, IgA   Collection Time: 01/28/24   2:06 PM  Result Value Ref Range   Transglutaminase IgA <2 0 - 3 U/mL  Reticulin Antibody, IgA w reflex titer   Collection Time: 01/28/24  2:06 PM  Result Value Ref Range   Reticulin Ab, IgA Negative Neg:<1:2.5 titer  HIV Antibody (routine testing w rflx)   Collection Time: 01/28/24  2:06 PM  Result Value Ref Range   HIV Screen 4th Generation wRfx Non Reactive Non Reactive  Hepatitis B surface antibody,quantitative   Collection Time: 01/28/24  2:06 PM  Result Value Ref Range   Hepatitis B Surf Ab Quant 1,323.0 Immunity>10 mIU/mL  RA Qn+CCP(IgG/A)+SjoSSA+SjoSSB   Collection Time: 01/28/24  2:06 PM  Result Value Ref Range   Rheumatoid fact SerPl-aCnc <10.0 <14.0 IU/mL   ENA SSA (RO) Ab 0.4 0.0 - 0.9 AI   ENA SSB (LA) Ab <0.2 0.0 - 0.9 AI   Cyclic Citrullin Peptide Ab 5 0 - 19 units  Sed Rate (ESR)   Collection Time: 01/28/24  2:06 PM  Result Value Ref Range   Sed Rate 5 0 - 32 mm/hr  CK (Creatine Kinase)   Collection Time: 01/28/24  2:06 PM  Result Value Ref Range   Total CK 45 32 - 182 U/L  ANA 12 Plus Profile (RDL)   Collection Time: 01/28/24  2:06 PM  Result Value Ref Range   Anti-Nuclear Ab by IFA (RDL) Positive (A) Negative  ANA 12 Plus Profile, Positive   Collection Time: 01/28/24  2:06 PM  Result Value Ref Range   Speckled Pattern 1:160 (H) <1:40   Note: Comment    Anti-Centromere Ab (RDL) <1:40 <1:40   Anti-dsDNA Ab by Farr(RDL) <8.0 <8.0 IU/mL   Anti-Sm Ab (RDL) <20 <20 Units   Anti-U1 RNP Ab (RDL) <20 <20 Units   Anti-Ro (SS-A) Ab (RDL) <20 <20 Units   Anti-La (SS-B) Ab (RDL) <20 <20 Units   Anti-Scl-70 Ab (RDL) <20 <20 Units   Anti-Cardiolipin Ab, IgG (RDL) <15 <15 GPL U/mL   Anti-Cardiolipin Ab, IgA (RDL) <12 <12 APL U/mL   Anti-Cardiolipin Ab, IgM (RDL) <13 <13 MPL U/mL   C3 Complement (RDL) 159 90 - 180 mg/dL   C4 Complement (RDL) 25 10 - 40 mg/dL   Anti-TPO Ab (RDL) 467.9 (H) <9.0 IU/mL   Anti-Chromatin Ab, IgG (RDL) <20 <20 Units   Anti-CCP Ab, IgG &  IgA (RDL) <20 <20 Units   Rheumatoid Factor by Turb RDL CANCELED IU/mL   ANA Plus 12 Interpretation Comment       Assessment & Plan:   Problem List Items Addressed This Visit       Musculoskeletal and Integument   Tinea corporis - Primary   Doing well with the oral lamisil . Rechecking labs today- if liver function is normal will refill x 3 months and follow up in 3 months. Call with any concerns. Plan to keep her on oral lamisil  for 6 months to a year to ensure erradication.       Relevant Orders   CBC with Differential/Platelet   Comprehensive metabolic panel with GFR     Follow up plan: Return for As scheduled.

## 2024-04-03 LAB — CBC WITH DIFFERENTIAL/PLATELET
Basophils Absolute: 0.1 x10E3/uL (ref 0.0–0.2)
Basos: 1 %
EOS (ABSOLUTE): 0 x10E3/uL (ref 0.0–0.4)
Eos: 0 %
Hematocrit: 41.4 % (ref 34.0–46.6)
Hemoglobin: 14.3 g/dL (ref 11.1–15.9)
Immature Grans (Abs): 0 x10E3/uL (ref 0.0–0.1)
Immature Granulocytes: 0 %
Lymphocytes Absolute: 2 x10E3/uL (ref 0.7–3.1)
Lymphs: 26 %
MCH: 34 pg — ABNORMAL HIGH (ref 26.6–33.0)
MCHC: 34.5 g/dL (ref 31.5–35.7)
MCV: 99 fL — ABNORMAL HIGH (ref 79–97)
Monocytes Absolute: 0.6 x10E3/uL (ref 0.1–0.9)
Monocytes: 7 %
Neutrophils Absolute: 5.1 x10E3/uL (ref 1.4–7.0)
Neutrophils: 66 %
Platelets: 394 x10E3/uL (ref 150–450)
RBC: 4.2 x10E6/uL (ref 3.77–5.28)
RDW: 11.6 % — ABNORMAL LOW (ref 11.7–15.4)
WBC: 7.7 x10E3/uL (ref 3.4–10.8)

## 2024-04-03 LAB — COMPREHENSIVE METABOLIC PANEL WITH GFR
ALT: 22 IU/L (ref 0–32)
AST: 17 IU/L (ref 0–40)
Albumin: 5.2 g/dL — ABNORMAL HIGH (ref 3.9–4.9)
Alkaline Phosphatase: 53 IU/L (ref 41–116)
BUN/Creatinine Ratio: 12 (ref 9–23)
BUN: 9 mg/dL (ref 6–20)
Bilirubin Total: 1.2 mg/dL (ref 0.0–1.2)
CO2: 25 mmol/L (ref 20–29)
Calcium: 10.4 mg/dL — ABNORMAL HIGH (ref 8.7–10.2)
Chloride: 98 mmol/L (ref 96–106)
Creatinine, Ser: 0.74 mg/dL (ref 0.57–1.00)
Globulin, Total: 2.5 g/dL (ref 1.5–4.5)
Glucose: 99 mg/dL (ref 70–99)
Potassium: 4 mmol/L (ref 3.5–5.2)
Sodium: 137 mmol/L (ref 134–144)
Total Protein: 7.7 g/dL (ref 6.0–8.5)
eGFR: 106 mL/min/1.73 (ref >59)

## 2024-04-04 ENCOUNTER — Ambulatory Visit: Payer: Self-pay | Admitting: Family Medicine

## 2024-04-04 MED ORDER — TERBINAFINE HCL 250 MG PO TABS: 250.0000 mg | ORAL_TABLET | Freq: Every day | ORAL | 0 refills | Status: DC

## 2024-06-18 NOTE — Progress Notes (Deleted)
 "   GYNECOLOGY ANNUAL PHYSICAL EXAM PROGRESS NOTE  Subjective:    Danielle Pearson is a 39 y.o. [id number] female who presents for an annual exam.  The patient is sexually active. The patient participates in regular exercise: no. Has the patient ever been transfused or tattooed?: yes. The patient reports that there is not domestic violence in her life.   The patient has the following complaints today: Patient is seeking to get pregnant and wants to know about the status of her fertility. She has not been actively trying to get pregnant.   Menstrual History: Menarche age: 66 Patient's last menstrual period was 06/21/2024. Period Cycle (Days): 27 Period Duration (Days): 2 Period Pattern: Regular Menstrual Flow: Moderate Menstrual Control: Tampon Menstrual Control Change Freq (Hours): 3-4 Dysmenorrhea: None   Gynecologic History:  Contraception: OCP (estrogen/progesterone) History of STI's:  Denies Last Pap: 10/11/2020. Results were: normal. Denies h/o abnormal pap smears. Last mammogram: Never Done, not age appropriate       OB History  Gravida Para Term Preterm AB Living  1 0 0 0 1 0  SAB IAB Ectopic Multiple Live Births  1 0 0 0 0    # Outcome Date GA Lbr Len/2nd Weight Sex Type Anes PTL Lv  1 SAB             Past Medical History:  Diagnosis Date   Tobacco abuse     Past Surgical History:  Procedure Laterality Date   WISDOM TOOTH EXTRACTION      Family History  Problem Relation Age of Onset   Hypertension Father     Social History   Socioeconomic History   Marital status: Married    Spouse name: Not on file   Number of children: Not on file   Years of education: some colle   Highest education level: Associate degree: academic program  Occupational History   Occupation: Haematologist  Tobacco Use   Smoking status: Former    Types: Cigarettes   Smokeless tobacco: Never  Vaping Use   Vaping status: Every Day  Substance and Sexual Activity    Alcohol use: Yes    Alcohol/week: 0.0 standard drinks of alcohol    Comment: Socially   Drug use: No   Sexual activity: Yes    Birth control/protection: Pill  Other Topics Concern   Not on file  Social History Narrative   Not on file   Social Drivers of Health   Tobacco Use: Medium Risk (04/02/2024)   Patient History    Smoking Tobacco Use: Former    Smokeless Tobacco Use: Never    Passive Exposure: Not on file  Financial Resource Strain: Low Risk (04/01/2024)   Overall Financial Resource Strain (CARDIA)    Difficulty of Paying Living Expenses: Not hard at all  Food Insecurity: No Food Insecurity (04/01/2024)   Epic    Worried About Programme Researcher, Broadcasting/film/video in the Last Year: Never true    Ran Out of Food in the Last Year: Never true  Transportation Needs: No Transportation Needs (04/01/2024)   Epic    Lack of Transportation (Medical): No    Lack of Transportation (Non-Medical): No  Physical Activity: Sufficiently Active (04/01/2024)   Exercise Vital Sign    Days of Exercise per Week: 4 days    Minutes of Exercise per Session: 60 min  Stress: Stress Concern Present (04/01/2024)   [employer] of Occupational Health - Occupational Stress Questionnaire    Feeling of Stress: Rather much  Social Connections: Socially Isolated (04/01/2024)   Social Connection and Isolation Panel    Frequency of Communication with Friends and Family: More than three times a week    Frequency of Social Gatherings with Friends and Family: More than three times a week    Attends Religious Services: Never    Database Administrator or Organizations: No    Attends Engineer, Structural: Not on file    Marital Status: Separated  Intimate Partner Violence: Not on file  Depression (PHQ2-9): High Risk (01/28/2024)   Depression (PHQ2-9)    PHQ-2 Score: 12  Alcohol Screen: Not on file  Housing: Not on file  Utilities: Not on file  Health Literacy: Not on file    Medications Ordered Prior to  Encounter[1]  Allergies[2]   Review of Systems Constitutional: negative for chills, fatigue, fevers and sweats Eyes: negative for irritation, redness and visual disturbance Ears, nose, mouth, throat, and face: negative for hearing loss, nasal congestion, snoring and tinnitus Respiratory: negative for asthma, cough, sputum Cardiovascular: negative for chest pain, dyspnea, exertional chest pressure/discomfort, irregular heart beat, palpitations and syncope Gastrointestinal: negative for abdominal pain, change in bowel habits, nausea and vomiting Genitourinary: negative for abnormal menstrual periods, genital lesions, sexual problems and vaginal discharge, dysuria and urinary incontinence Integument/breast: negative for breast lump, breast tenderness and nipple discharge Hematologic/lymphatic: negative for bleeding and easy bruising Musculoskeletal:negative for back pain and muscle weakness Neurological: negative for dizziness, headaches, vertigo and weakness Endocrine: negative for diabetic symptoms including polydipsia, polyuria and skin dryness Allergic/Immunologic: negative for hay fever and urticaria      Objective:  There were no vitals taken for this visit. There is no height or weight on file to calculate BMI.    General Appearance:    Alert, cooperative, no distress, appears stated age  Head:    Normocephalic, without obvious abnormality, atraumatic  Eyes:    PERRL, conjunctiva/corneas clear, EOM's intact, both eyes  Ears:    Normal external ear canals, both ears  Nose:   Nares normal, septum midline, mucosa normal, no drainage or sinus tenderness  Throat:   Lips, mucosa, and tongue normal; teeth and gums normal  Neck:   Supple, symmetrical, trachea midline, no adenopathy; thyroid : no enlargement/tenderness/nodules; no carotid bruit or JVD  Back:     Symmetric, no curvature, ROM normal, no CVA tenderness  Lungs:     Clear to auscultation bilaterally, respirations unlabored   Chest Wall:    No tenderness or deformity   Heart:    Regular rate and rhythm, S1 and S2 normal, no murmur, rub or gallop  Breast Exam:    No tenderness, masses, or nipple abnormality  Abdomen:     Soft, non-tender, bowel sounds active all four quadrants, no masses, no organomegaly.    Genitalia:    Pelvic:external genitalia normal, vagina without lesions, discharge, or tenderness, rectovaginal septum  normal. Cervix normal in appearance, no cervical motion tenderness, no adnexal masses or tenderness.  Uterus normal size, shape, mobile, regular contours, nontender.  Rectal:    Normal external sphincter.  No hemorrhoids appreciated. Internal exam not done.   Extremities:   Extremities normal, atraumatic, no cyanosis or edema  Pulses:   2+ and symmetric all extremities  Skin:   Skin color, texture, turgor normal, no rashes or lesions  Lymph nodes:   Cervical, supraclavicular, and axillary nodes normal  Neurologic:   CNII-XII intact, normal strength, sensation and reflexes throughout   .  Labs:  Lab Results  Component Value Date   WBC 7.7 04/02/2024   HGB 14.3 04/02/2024   HCT 41.4 04/02/2024   MCV 99 (H) 04/02/2024   PLT 394 04/02/2024    Lab Results  Component Value Date   CREATININE 0.74 04/02/2024   BUN 9 04/02/2024   NA 137 04/02/2024   K 4.0 04/02/2024   CL 98 04/02/2024   CO2 25 04/02/2024    Lab Results  Component Value Date   ALT 22 04/02/2024   AST 17 04/02/2024   ALKPHOS 53 04/02/2024   BILITOT 1.2 04/02/2024    Lab Results  Component Value Date   TSH 2.110 01/28/2024     Assessment:   1. Encounter for well woman exam with routine gynecological exam   2. Need for hepatitis C screening test   3. Screening for diabetes mellitus (DM)   4. Screening cholesterol level   5. Hashimoto's thyroiditis   6. Menorrhagia with regular cycle   7. Encounter for surveillance of contraceptive pills      Plan:  Blood tests: Pending. Breast self exam technique  reviewed and patient encouraged to perform self-exam monthly. Contraception: OCP (estrogen/progesterone). Discussed healthy lifestyle modifications. Mammogram Not age appropriate Pap smear UTD. Flu vaccine: Declined Follow up in 1 year for annual exam   Damien Parsley, CNM [facility]     [1]  Current Outpatient Medications on File Prior to Visit  Medication Sig Dispense Refill   busPIRone  (BUSPAR ) 5 MG tablet Take 1 tablet (5 mg total) by mouth 3 (three) times daily as needed. 90 tablet 2   terbinafine  (LAMISIL ) 1 % cream Apply 1 Application topically 2 (two) times daily. 30 g 6   terbinafine  (LAMISIL ) 250 MG tablet Take 1 tablet (250 mg total) by mouth daily. 90 tablet 0   [DISCONTINUED] traZODone  (DESYREL ) 50 MG tablet TAKE 0.5-1 TABLETS (25-50 MG TOTAL) BY MOUTH AT BEDTIME AS NEEDED FOR SLEEP. 30 tablet 1   No current facility-administered medications on file prior to visit.  [2] No Known Allergies  "

## 2024-06-18 NOTE — Patient Instructions (Signed)
 How to Do a Breast Self-Exam  Doing breast self-exams can help you stay healthy. They're one way to know what's normal for your breasts. They can help you catch a problem while it's still small and can be treated. You need to: Check your breasts often. Tell your doctor about any changes. You should do breast self-exams even if you have breast implants. What you need: A mirror. A well-lit room. A pillow or other soft object. How to do a breast self-exam Look for changes  Take off all the clothes above your waist. Stand in front of a mirror in a room with good lighting. Put your hands down at your sides. Compare your breasts in the mirror. Look for difference between them, such as: Differences in shape. Differences in size. Wrinkles, dips, and bumps in one breast and not the other. Look at each breast for skin changes, such as: Redness. Scaly spots. Spots where your skin is thicker. Dimpling. Open sores. Look for changes in your nipples, such as: Fluid coming out of a nipple. Fluid around a nipple. Bleeding. Dimpling. Redness. A nipple that looks pushed in or that has changed position. Feel for changes Lie on your back. Feel each breast. To do this: Pick a breast to feel. Place a pillow under the shoulder closest to that breast. Put the arm closest to that breast behind your head. Feel the breast using the hand of your other arm. Use the pads of your three middle fingers to make small circles starting near the nipple. Use light, medium, and firm pressure. Keep making circles, moving down over the breast. Stop when you feel your ribs. Start making circles with your fingers again, this time going up until you reach your collarbone. Then, make circles out across your breast and into your armpit area. Squeeze your nipple. Check for fluid and lumps. Do these steps again to check your other breast. Sit or stand in the tub or shower. With soapy water on your skin, feel each  breast the same way you did when you were lying down. Write down what you find Writing down what you find can help you keep track of what you want to tell your doctor. Write down: What's normal for each breast. Any changes you find. Write down: The kind of change. If your breast feels tender or painful. Any lump you find. Write down its size and where it is. When you last had your period. General tips If you're breastfeeding, the best time to check your breasts is after you feed your baby or after you use a breast pump. If you get a period, the best time to check your breasts is 5-7 days after your period ends. With time, you'll get more used to doing the self-exam. You'll also start to know if there are changes in your breasts. Contact a doctor if: You see a change in the shape or size of your breasts or nipples. You see a change in the skin of your breast or nipples. You have fluid coming from your nipples that isn't normal. You find a new lump or thick area. You have breast pain. You have any concerns about your breast health. This information is not intended to replace advice given to you by your health care provider. Make sure you discuss any questions you have with your health care provider. Document Revised: 08/13/2023 Document Reviewed: 08/13/2023 Elsevier Patient Education  2025 [employer].  Preventive Care 29-65 Years Old, Female Preventive care refers to lifestyle  choices and visits with your health care provider that can promote health and wellness. Preventive care visits are also called wellness exams. What can I expect for my preventive care visit? Counseling During your preventive care visit, your health care provider may ask about your: Medical history, including: Past medical problems. Family medical history. Pregnancy history. Current health, including: Menstrual cycle. Method of birth control. Emotional well-being. Home life and relationship  well-being. Sexual activity and sexual health. Lifestyle, including: Alcohol, nicotine  or tobacco, and drug use. Access to firearms. Diet, exercise, and sleep habits. Work and work Astronomer. Sunscreen use. Safety issues such as seatbelt and bike helmet use. Physical exam Your health care provider may check your: Height and weight. These may be used to calculate your BMI (body mass index). BMI is a measurement that tells if you are at a healthy weight. Waist circumference. This measures the distance around your waistline. This measurement also tells if you are at a healthy weight and may help predict your risk of certain diseases, such as type 2 diabetes and high blood pressure. Heart rate and blood pressure. Body temperature. Skin for abnormal spots. What immunizations do I need?  Vaccines are usually given at various ages, according to a schedule. Your health care provider will recommend vaccines for you based on your age, medical history, and lifestyle or other factors, such as travel or where you work. What tests do I need? Screening Your health care provider may recommend screening tests for certain conditions. This may include: Pelvic exam and Pap test. Lipid and cholesterol levels. Diabetes screening. This is done by checking your blood sugar (glucose) after you have not eaten for a while (fasting). Hepatitis B test. Hepatitis C test. HIV (human immunodeficiency virus) test. STI (sexually transmitted infection) testing, if you are at risk. BRCA-related cancer screening. This may be done if you have a family history of breast, ovarian, tubal, or peritoneal cancers. Talk with your health care provider about your test results, treatment options, and if necessary, the need for more tests. Follow these instructions at home: Eating and drinking  Eat a healthy diet that includes fresh fruits and vegetables, whole grains, lean protein, and low-fat dairy products. Take vitamin and  mineral supplements as recommended by your health care provider. Do not drink alcohol if: Your health care provider tells you not to drink. You are pregnant, may be pregnant, or are planning to become pregnant. If you drink alcohol: Limit how much you have to 0-1 drink a day. Know how much alcohol is in your drink. In the [place] one drink equals one 12 oz bottle of beer (355 mL), one 5 oz glass of wine (148 mL), or one 1 oz glass of hard liquor (44 mL). Lifestyle Brush your teeth every morning and night with fluoride toothpaste. Floss one time each day. Exercise for at least 30 minutes 5 or more days each week. Do not use any products that contain nicotine  or tobacco. These products include cigarettes, chewing tobacco, and vaping devices, such as e-cigarettes. If you need help quitting, ask your health care provider. Do not use drugs. If you are sexually active, practice safe sex. Use a condom or other form of protection to prevent STIs. If you do not wish to become pregnant, use a form of birth control. If you plan to become pregnant, see your health care provider for a prepregnancy visit. Find healthy ways to manage stress, such as: Meditation, yoga, or listening to music. Journaling. Talking to a trusted  person. Spending time with friends and family. Minimize exposure to UV radiation to reduce your risk of skin cancer. Safety Always wear your seat belt while driving or riding in a vehicle. Do not drive: If you have been drinking alcohol. Do not ride with someone who has been drinking. If you have been using any mind-altering substances or drugs. While texting. When you are tired or distracted. Wear a helmet and other protective equipment during sports activities. If you have firearms in your house, make sure you follow all gun safety procedures. Seek help if you have been physically or sexually abused. What's next? Go to your health care provider once a year for an annual wellness  visit. Ask your health care provider how often you should have your eyes and teeth checked. Stay up to date on all vaccines. This information is not intended to replace advice given to you by your health care provider. Make sure you discuss any questions you have with your health care provider. Document Revised: 11/29/2020 Document Reviewed: 11/29/2020 Elsevier Patient Education  2024 [employer].

## 2024-06-21 ENCOUNTER — Encounter: Payer: Self-pay | Admitting: Certified Nurse Midwife

## 2024-06-21 ENCOUNTER — Ambulatory Visit (INDEPENDENT_AMBULATORY_CARE_PROVIDER_SITE_OTHER): Admitting: Certified Nurse Midwife

## 2024-06-21 VITALS — BP 133/87 | HR 82 | Resp 16 | Ht 65.0 in | Wt 160.6 lb

## 2024-06-21 DIAGNOSIS — Z3041 Encounter for surveillance of contraceptive pills: Secondary | ICD-10-CM

## 2024-06-21 DIAGNOSIS — Z1159 Encounter for screening for other viral diseases: Secondary | ICD-10-CM

## 2024-06-21 DIAGNOSIS — E063 Autoimmune thyroiditis: Secondary | ICD-10-CM

## 2024-06-21 DIAGNOSIS — Z13220 Encounter for screening for lipoid disorders: Secondary | ICD-10-CM

## 2024-06-21 DIAGNOSIS — Z01411 Encounter for gynecological examination (general) (routine) with abnormal findings: Secondary | ICD-10-CM

## 2024-06-21 DIAGNOSIS — Z131 Encounter for screening for diabetes mellitus: Secondary | ICD-10-CM

## 2024-06-21 DIAGNOSIS — N920 Excessive and frequent menstruation with regular cycle: Secondary | ICD-10-CM

## 2024-06-21 DIAGNOSIS — Z01419 Encounter for gynecological examination (general) (routine) without abnormal findings: Secondary | ICD-10-CM

## 2024-06-21 NOTE — Progress Notes (Signed)
 "   GYNECOLOGY ANNUAL PHYSICAL EXAM PROGRESS NOTE  Subjective:    Danielle Pearson is a 39 y.o. G61P0010 female who presents for an annual exam.  The patient is sexually active. The patient participates in regular exercise: no. Has the patient ever been transfused or tattooed?: yes. The patient reports that there is not domestic violence in her life.   The patient has the following complaints today: Patient is seeking to get pregnant and wants to know about the status of her fertility. She has not been actively trying to get pregnant. Has not been on birth control for the last 6 months. Reports having regular periods except for the last two months where their period came a few days earlier. No abnormal spotting or heavy flow.   Menstrual History: Menarche age: 45 Patient's last menstrual period was 06/21/2024. Period Cycle (Days): 27 Period Duration (Days): 2 Period Pattern: Regular Menstrual Flow: Moderate Menstrual Control: Tampon Menstrual Control Change Freq (Hours): 3-4 Dysmenorrhea: None   Gynecologic History:  Contraception: OCP (estrogen/progesterone) History of STI's:  Denies Last Pap: 10/11/2020. Results were: normal. Denies h/o abnormal pap smears. Last mammogram: Never Done, not age appropriate    Upstream - 06/21/24 1345       Pregnancy Intention Screening   Does the patient want to become pregnant in the next year? Yes    Does the patient's partner want to become pregnant in the next year? Ok Either Way    Would the patient like to discuss contraceptive options today? No      Contraception Wrap Up   Current Method No Method - Other Reason    End Method No Method - Other Reason    Contraception Counseling Provided Yes    How was the end contraceptive method provided? Prescription            OB History  Gravida Para Term Preterm AB Living  1 0 0 0 1 0  SAB IAB Ectopic Multiple Live Births  1 0 0 0 0    # Outcome Date GA Lbr Len/2nd Weight Sex Type  Anes PTL Lv  1 SAB             Past Medical History:  Diagnosis Date   Tobacco abuse     Past Surgical History:  Procedure Laterality Date   WISDOM TOOTH EXTRACTION      Family History  Problem Relation Age of Onset   Hypertension Father     Social History   Socioeconomic History   Marital status: Legally Separated    Spouse name: Not on file   Number of children: Not on file   Years of education: some colle   Highest education level: Associate degree: academic program  Occupational History   Occupation: Haematologist  Tobacco Use   Smoking status: Former    Types: Cigarettes   Smokeless tobacco: Never  Vaping Use   Vaping status: Every Day  Substance and Sexual Activity   Alcohol use: Yes    Alcohol/week: 0.0 standard drinks of alcohol    Comment: Socially   Drug use: No   Sexual activity: Yes    Birth control/protection: Pill  Other Topics Concern   Not on file  Social History Narrative   Not on file   Social Drivers of Health   Tobacco Use: Medium Risk (06/21/2024)   Patient History    Smoking Tobacco Use: Former    Smokeless Tobacco Use: Never    Passive Exposure: Not on  file  Financial Resource Strain: Low Risk (04/01/2024)   Overall Financial Resource Strain (CARDIA)    Difficulty of Paying Living Expenses: Not hard at all  Food Insecurity: No Food Insecurity (04/01/2024)   Epic    Worried About Programme Researcher, Broadcasting/film/video in the Last Year: Never true    Ran Out of Food in the Last Year: Never true  Transportation Needs: No Transportation Needs (04/01/2024)   Epic    Lack of Transportation (Medical): No    Lack of Transportation (Non-Medical): No  Physical Activity: Sufficiently Active (04/01/2024)   Exercise Vital Sign    Days of Exercise per Week: 4 days    Minutes of Exercise per Session: 60 min  Stress: Stress Concern Present (04/01/2024)   [employer] of Occupational Health - Occupational Stress Questionnaire    Feeling of Stress: Rather  much  Social Connections: Socially Isolated (04/01/2024)   Social Connection and Isolation Panel    Frequency of Communication with Friends and Family: More than three times a week    Frequency of Social Gatherings with Friends and Family: More than three times a week    Attends Religious Services: Never    Database Administrator or Organizations: No    Attends Engineer, Structural: Not on file    Marital Status: Separated  Intimate Partner Violence: Not on file  Depression (PHQ2-9): Low Risk (06/21/2024)   Depression (PHQ2-9)    PHQ-2 Score: 0  Alcohol Screen: Not on file  Housing: Not on file  Utilities: Not on file  Health Literacy: Not on file    Medications Ordered Prior to Encounter[1]  Allergies[2]   Review of Systems Constitutional: negative for chills, fatigue, fevers and sweats Eyes: negative for irritation, redness and visual disturbance Ears, nose, mouth, throat, and face: negative for hearing loss, nasal congestion, snoring and tinnitus Respiratory: negative for asthma, cough, sputum Cardiovascular: negative for chest pain, dyspnea, exertional chest pressure/discomfort, irregular heart beat, palpitations and syncope Gastrointestinal: negative for abdominal pain, change in bowel habits, nausea and vomiting Genitourinary: negative for abnormal menstrual periods, genital lesions, sexual problems and vaginal discharge, dysuria and urinary incontinence Integument/breast: negative for breast lump, breast tenderness and nipple discharge Hematologic/lymphatic: negative for bleeding and easy bruising Musculoskeletal:negative for back pain and muscle weakness Neurological: negative for dizziness, headaches, vertigo and weakness Endocrine: negative for diabetic symptoms including polydipsia, polyuria and skin dryness Allergic/Immunologic: negative for hay fever and urticaria      Objective:  Blood pressure 133/87, pulse 82, resp. rate 16, height 5' 5 (1.651 m),  weight 72.8 kg, last menstrual period 06/21/2024. Body mass index is 26.73 kg/m.    General Appearance:    Alert, cooperative, no distress, appears stated age  Head:    Normocephalic, without obvious abnormality, atraumatic        Nose:   Nares normal, septum midline, mucosa normal, no drainage or sinus tenderness  Throat:   Lips, mucosa, and tongue normal; teeth and gums normal  Neck:   Supple, symmetrical, trachea midline, no adenopathy; thyroid : no enlargement/tenderness/nodules; no carotid bruit or JVD  Back:     Symmetric, no curvature, ROM normal, no CVA tenderness  Lungs:     Clear to auscultation bilaterally, respirations unlabored  Chest Wall:    No tenderness or deformity   Heart:    Regular rate and rhythm, S1 and S2 normal, no murmur, rub or gallop  Breast Exam:    No tenderness, known sebaceous cyst on left breast at  2 o clock with not changes  Abdomen:     Soft, non-tender, bowel sounds active all four quadrants, no masses, no organomegaly.    Genitalia:   Deferred   Rectal:  Deferred   Extremities:   Extremities normal, atraumatic, no cyanosis or edema  Pulses:   2+ and symmetric all extremities  Skin:   Skin color, texture, turgor normal, no rashes or lesions  Lymph nodes:   Cervical, supraclavicular, and axillary nodes normal  Neurologic:   CNII-XII intact, normal strength, sensation and reflexes throughout   .  Labs:  Lab Results  Component Value Date   WBC 7.7 04/02/2024   HGB 14.3 04/02/2024   HCT 41.4 04/02/2024   MCV 99 (H) 04/02/2024   PLT 394 04/02/2024    Lab Results  Component Value Date   CREATININE 0.74 04/02/2024   BUN 9 04/02/2024   NA 137 04/02/2024   K 4.0 04/02/2024   CL 98 04/02/2024   CO2 25 04/02/2024    Lab Results  Component Value Date   ALT 22 04/02/2024   AST 17 04/02/2024   ALKPHOS 53 04/02/2024   BILITOT 1.2 04/02/2024    Lab Results  Component Value Date   TSH 2.110 01/28/2024     Assessment:   1. Encounter for  well woman exam with routine gynecological exam   2. Need for hepatitis C screening test   3. Screening for diabetes mellitus (DM)   4. Screening cholesterol level   5. Hashimoto's thyroiditis   6. Menorrhagia with regular cycle   7. Encounter for surveillance of contraceptive pills      Plan:  Blood tests: none Breast self exam technique reviewed and patient encouraged to perform self-exam monthly. Contraception: none Discussed healthy lifestyle modifications. Mammogram Not age appropriate Pap smear UTD. Flu vaccine: Declined Discussed using over the counter ovulation strips to determine fertility window If actively trying to conceive for 6 months and unsuccessful, return for fertility workup  Follow up in 1 year for annual exam   Damien Parsley, CNM [facility]      [1]  Current Outpatient Medications on File Prior to Visit  Medication Sig Dispense Refill   busPIRone  (BUSPAR ) 5 MG tablet Take 1 tablet (5 mg total) by mouth 3 (three) times daily as needed. 90 tablet 2   terbinafine  (LAMISIL ) 1 % cream Apply 1 Application topically 2 (two) times daily. 30 g 6   terbinafine  (LAMISIL ) 250 MG tablet Take 1 tablet (250 mg total) by mouth daily. 90 tablet 0   [DISCONTINUED] traZODone  (DESYREL ) 50 MG tablet TAKE 0.5-1 TABLETS (25-50 MG TOTAL) BY MOUTH AT BEDTIME AS NEEDED FOR SLEEP. 30 tablet 1   No current facility-administered medications on file prior to visit.  [2] No Known Allergies  "

## 2024-06-24 ENCOUNTER — Encounter: Admitting: Family Medicine

## 2024-06-24 ENCOUNTER — Other Ambulatory Visit: Payer: Self-pay | Admitting: Family Medicine

## 2024-06-24 NOTE — Telephone Encounter (Signed)
 Requested medications are due for refill today.  yes  Requested medications are on the active medications list.  yes  Last refill. 04/04/2024 #90 0 rf  Future visit scheduled.   yes  Notes to clinic.  Medication not assigned to a protocol. Please review for refill.     Requested Prescriptions  Pending Prescriptions Disp Refills   terbinafine  (LAMISIL ) 250 MG tablet 90 tablet 0    Sig: Take 1 tablet (250 mg total) by mouth daily.     Off-Protocol Failed - 06/24/2024  4:25 PM      Failed - Medication not assigned to a protocol, review manually.      Passed - Valid encounter within last 12 months    Recent Outpatient Visits           2 months ago Tinea corporis   [facility] [place], [place] P, DO   4 months ago Rash   [facility] [place], [place], [place]

## 2024-06-24 NOTE — Telephone Encounter (Signed)
 Copied from CRM #[id number]. Topic: Clinical - Medication Refill >> Jun 24, 2024  8:29 AM Larissa RAMAN wrote: Medication: terbinafine  (LAMISIL ) 250 MG tablet  Has the patient contacted their pharmacy? Yes (Agent: If no, request that the patient contact the pharmacy for the refill. If patient does not wish to contact the pharmacy document the reason why and proceed with request.) (Agent: If yes, when and what did the pharmacy advise?)  This is the patient's preferred pharmacy:  [id number] [address] [facility] - [address] [place] [address] Phone: [phone] Fax: [phone]  Is this the correct pharmacy for this prescription? Yes If no, delete pharmacy and type the correct one.   Has the prescription been filled recently? No  Is the patient out of the medication? No  Has the patient been seen for an appointment in the last year OR does the patient have an upcoming appointment? Yes  Can we respond through MyChart? Yes  Agent: Please be advised that Rx refills may take up to 3 business days. We ask that you follow-up with your pharmacy.

## 2024-07-04 MED ORDER — TERBINAFINE HCL 250 MG PO TABS: 250.0000 mg | ORAL_TABLET | Freq: Every day | ORAL | 0 refills | Status: AC

## 2024-07-27 ENCOUNTER — Encounter: Admitting: Family Medicine
